# Patient Record
Sex: Male | Born: 1960
Health system: Southern US, Community
[De-identification: ages and names within clinical notes are randomized; demographics above are authoritative.]

## PROBLEM LIST (undated history)

## (undated) DIAGNOSIS — M549 Dorsalgia, unspecified: Secondary | ICD-10-CM

## (undated) DIAGNOSIS — G4730 Sleep apnea, unspecified: Secondary | ICD-10-CM

## (undated) DIAGNOSIS — K219 Gastro-esophageal reflux disease without esophagitis: Secondary | ICD-10-CM

## (undated) DIAGNOSIS — M654 Radial styloid tenosynovitis [de Quervain]: Secondary | ICD-10-CM

## (undated) HISTORY — PX: BACK SURGERY: SHX140

## (undated) HISTORY — PX: HERNIA REPAIR: SHX51

---

## 2004-01-08 ENCOUNTER — Ambulatory Visit (HOSPITAL_COMMUNITY): Admission: RE | Admit: 2004-01-08 | Discharge: 2004-01-08 | Payer: Self-pay | Admitting: Family Medicine

## 2005-08-09 ENCOUNTER — Emergency Department (HOSPITAL_COMMUNITY): Admission: EM | Admit: 2005-08-09 | Discharge: 2005-08-10 | Payer: Self-pay | Admitting: Emergency Medicine

## 2006-04-04 ENCOUNTER — Ambulatory Visit (HOSPITAL_COMMUNITY): Admission: RE | Admit: 2006-04-04 | Discharge: 2006-04-05 | Payer: Self-pay | Admitting: Neurosurgery

## 2008-10-28 ENCOUNTER — Encounter: Admission: RE | Admit: 2008-10-28 | Discharge: 2008-10-28 | Payer: Self-pay | Admitting: Family Medicine

## 2009-05-08 ENCOUNTER — Encounter: Admission: RE | Admit: 2009-05-08 | Discharge: 2009-05-08 | Payer: Self-pay | Admitting: Internal Medicine

## 2009-08-11 ENCOUNTER — Ambulatory Visit (HOSPITAL_COMMUNITY): Admission: RE | Admit: 2009-08-11 | Discharge: 2009-08-11 | Payer: Self-pay | Admitting: Neurosurgery

## 2010-06-07 LAB — CBC
HCT: 44.7 % (ref 39.0–52.0)
Hemoglobin: 15.1 g/dL (ref 13.0–17.0)
MCHC: 33.8 g/dL (ref 30.0–36.0)
MCV: 89.1 fL (ref 78.0–100.0)
Platelets: 321 10*3/uL (ref 150–400)
RBC: 5.01 MIL/uL (ref 4.22–5.81)
RDW: 13.6 % (ref 11.5–15.5)
WBC: 7.3 10*3/uL (ref 4.0–10.5)

## 2010-06-07 LAB — SURGICAL PCR SCREEN
MRSA, PCR: NEGATIVE
Staphylococcus aureus: NEGATIVE

## 2010-08-06 NOTE — Op Note (Signed)
NAME:  Robert Deleon, Robert Deleon          ACCOUNT NO.:  [id number]   MEDICAL RECORD NO.:  [id number]          PATIENT TYPE:  AMB   LOCATION:  SDS                          FACILITY:  [facility]   PHYSICIAN:  Reinaldo Meeker, M.D. DATE OF BIRTH:  [birth year]   DATE OF PROCEDURE:  04/04/2006  DATE OF DISCHARGE:                               OPERATIVE REPORT   PREOPERATIVE DIAGNOSIS:  Herniated disk, L2-3 left, with superior  fragment.   POSTOPERATIVE DIAGNOSIS:  Herniated disk, L2-3 left, with superior  fragment.   PROCEDURE:  Left L2-3 interlaminar laminotomy for excision of herniated  disk with the operating microscope.   SECONDARY PROCEDURE:  Microdissection, L2 and L3 nerve roots as well as  02/03 disk.   SURGEON:  Dr. Gerlene Fee.   ASSISTANT:  Dr. Julio Sicks   PROCEDURE IN DETAIL:  After being placed in the prone position, the  patient's back was prepped and draped in usual sterile fashion.  Localizing x-rays taken prior to incision to identify the appropriate  level.  Midline incision was made above the spinous processes of L2-L3.  Using Bovie cutting current, the incision was carried down the spinous  processes.  Subperiosteal dissection was then carried out along the left-  sided spinous processes, lamina and facet joint and self retaining  retractor was placed for exposure.  X-rays showed approach the  appropriate levels.  Using a high-speed drill, the inferior three-  quarters of the L2 lamina, medial one half of the facet joint in the  superior edge of the L3 lamina were removed.  Residual bone, ligamentum  flavum were removed in a piecemeal fashion.  L2-L3 nerve roots were  identified with the large herniated disk fragment located between the  two of them.  A microscope was draped, brought to the field and used for  remainder of the case.  Using a the L2-3 disk was coagulated and then  incised by 15 blade.  Using pituitary rongeurs and curettes, the disk  space clean-out was  carried out while at the same time, great care was  taken to avoid injury to the neural elements.  This was successfully  done.  Attention was then turned superior where the fragment was  identified.  This was initially fished out from underneath the L2 nerve  root and then without closing breakage of the fragment, a very large  single fragment was identified.  Instruments were then able to be passed  on the L2 nerve root without difficulty.  Additional foraminal clean-out  was carried out of the disk and then inspection was carried out in all  directions for any evidence of residual compression of the L2 or L3  nerve roots and none could be identified.  Large amounts of irrigation  were carried out at this time.  Any bleeding was controlled with bipolar  coagulation and Gelfoam.  The wound was then closed in multiple layers  of Vicryl in the muscle, fascia, subcutaneous and subcuticular tissues  and staples were placed on the skin.  A sterile dressings then applied.  The patient was extubated and taken to recovery room in stable  condition.  ______________________________  Reinaldo Meeker, M.D.     ROK/MEDQ  D:  04/04/2006  T:  04/05/2006  Job:  [id number]

## 2013-05-06 ENCOUNTER — Emergency Department (HOSPITAL_BASED_OUTPATIENT_CLINIC_OR_DEPARTMENT_OTHER)
Admission: EM | Admit: 2013-05-06 | Discharge: 2013-05-06 | Disposition: A | Discharge status: Home / Self Care | Payer: BC Managed Care – PPO | Attending: Emergency Medicine | Admitting: Emergency Medicine

## 2013-05-06 ENCOUNTER — Emergency Department (HOSPITAL_BASED_OUTPATIENT_CLINIC_OR_DEPARTMENT_OTHER): Payer: BC Managed Care – PPO

## 2013-05-06 ENCOUNTER — Encounter (HOSPITAL_BASED_OUTPATIENT_CLINIC_OR_DEPARTMENT_OTHER): Payer: Self-pay | Admitting: Emergency Medicine

## 2013-05-06 DIAGNOSIS — Z791 Long term (current) use of non-steroidal anti-inflammatories (NSAID): Secondary | ICD-10-CM | POA: Insufficient documentation

## 2013-05-06 DIAGNOSIS — M5430 Sciatica, unspecified side: Secondary | ICD-10-CM | POA: Insufficient documentation

## 2013-05-06 HISTORY — DX: Dorsalgia, unspecified: M54.9

## 2013-05-06 MED ORDER — LIDOCAINE 5 % EX PTCH: 1.0000 | MEDICATED_PATCH | CUTANEOUS | Status: DC

## 2013-05-06 MED ORDER — PREDNISONE 20 MG PO TABS: ORAL_TABLET | ORAL | Status: DC

## 2013-05-06 MED ORDER — MELOXICAM 7.5 MG PO TABS: 7.5000 mg | ORAL_TABLET | Freq: Every day | ORAL | Status: DC

## 2013-05-06 MED ORDER — KETOROLAC TROMETHAMINE 60 MG/2ML IM SOLN
60.0000 mg | Freq: Once | INTRAMUSCULAR | Status: AC
  Administered 2013-05-06: 60 mg via INTRAMUSCULAR
  Filled 2013-05-06: qty 2

## 2013-05-06 MED ORDER — METAXALONE 800 MG PO TABS: 800.0000 mg | ORAL_TABLET | Freq: Three times a day (TID) | ORAL | Status: DC

## 2013-05-06 MED ORDER — DEXAMETHASONE SODIUM PHOSPHATE 10 MG/ML IJ SOLN
10.0000 mg | Freq: Once | INTRAMUSCULAR | Status: AC
  Administered 2013-05-06: 10 mg via INTRAMUSCULAR
  Filled 2013-05-06: qty 1

## 2013-05-06 NOTE — ED Notes (Signed)
rx x 3 given for skelaxin, lidoderm and mobic- pt has a ride at bedside

## 2013-05-06 NOTE — ED Provider Notes (Signed)
CSN: [id number]     Arrival date & time 05/06/13  0115 History   First MD Initiated Contact with Patient 05/06/13 0139     Chief Complaint  Patient presents with  . Back Pain     (Consider location/radiation/quality/duration/timing/severity/associated sxs/prior Treatment) Patient is a 53 y.o. male presenting with back pain. The history is provided by the patient. No language interpreter was used.  Back Pain Location:  Sacro-iliac joint Quality:  Aching Radiates to:  R posterior upper leg Pain severity:  Moderate Pain is:  Same all the time Onset quality:  Gradual Timing:  Constant Progression:  Unchanged Chronicity:  Recurrent Context: lifting heavy objects   Context: not emotional stress, not MCA and not MVA   Relieved by:  Nothing Worsened by:  Nothing tried Ineffective treatments:  NSAIDs, narcotics and muscle relaxants Associated symptoms: no abdominal pain, no abdominal swelling, no bladder incontinence, no bowel incontinence, no chest pain, no dysuria, no fever, no headaches, no leg pain, no numbness, no paresthesias, no pelvic pain, no perianal numbness, no tingling, no weakness and no weight loss   Risk factors: no hx of cancer     Past Medical History  Diagnosis Date  . Back pain    Past Surgical History  Procedure Laterality Date  . Back surgery    . Hernia repair     No family history on file. History  Substance Use Topics  . Smoking status: Never Smoker   . Smokeless tobacco: Never Used  . Alcohol Use: Yes    Review of Systems  Constitutional: Negative for fever and weight loss.  Cardiovascular: Negative for chest pain.  Gastrointestinal: Negative for abdominal pain and bowel incontinence.  Genitourinary: Negative for bladder incontinence, dysuria and pelvic pain.  Musculoskeletal: Positive for back pain.  Neurological: Negative for tingling, weakness, numbness, headaches and paresthesias.  All other systems reviewed and are  negative.      Allergies  Review of patient's allergies indicates no known allergies.  Home Medications   Current Outpatient Rx  Name  Route  Sig  Dispense  Refill  . Methocarbamol (ROBAXIN PO)   Oral   Take by mouth.         . naproxen sodium (ANAPROX) 220 MG tablet   Oral   Take 220 mg by mouth 2 (two) times daily with a meal.         . oxyCODONE-acetaminophen (PERCOCET) 7.5-325 MG per tablet   Oral   Take 1 tablet by mouth every 4 (four) hours as needed for pain.          BP 139/81  Pulse 74  Temp(Src) 98 F (36.7 C) (Oral)  Resp 18  Ht 5\' 10"  (1.778 m)  Wt 198 lb (89.812 kg)  BMI 28.41 kg/m2  SpO2 100% Physical Exam  Constitutional: He is oriented to person, place, and time. He appears well-developed and well-nourished. No distress.  HENT:  Head: Normocephalic and atraumatic.  Mouth/Throat: Oropharynx is clear and moist.  Eyes: Conjunctivae are normal. Pupils are equal, round, and reactive to light.  Neck: Normal range of motion. Neck supple.  Cardiovascular: Normal rate, regular rhythm and intact distal pulses.   Pulmonary/Chest: Effort normal and breath sounds normal. He has no wheezes. He has no rales.  Abdominal: Soft. Bowel sounds are normal. There is no tenderness. There is no rebound and no guarding.  Musculoskeletal: Normal range of motion.  Neurological: He is alert and oriented to person, place, and time.  Skin: Skin is  warm and dry.  Psychiatric: He has a normal mood and affect.    ED Course  Procedures (including critical care time) Labs Review Labs Reviewed - No data to display Imaging Review Dg Lumbar Spine Complete  05/06/2013   CLINICAL DATA:  Back pain.  EXAM: LUMBAR SPINE - COMPLETE 4+ VIEW  COMPARISON:  None.  FINDINGS: Degenerative disc disease changes at L5-S1 and L2-3 with disc space narrowing and endplate spurring. Mild facet disease in the mid and lower lumbar spine. Normal alignment. No fracture. SI joints are symmetric and  unremarkable.  Calcification projects over the midpole of the left kidney, likely nonobstructing stone measuring up to 7 mm.  IMPRESSION: Degenerative changes in the lumbar spine. No acute bony abnormality.  Left nephrolithiasis.   Electronically Signed   By: Rolm Baptise M.D.   On: 05/06/2013 02:26    EKG Interpretation   None       MDM   Final diagnoses:  None    Will change to skelaxin, add lidoderm patches, prednisone and meloxicam.  Follow up with Dr. Hal Neer    Avo Schlachter Alfonso Patten, MD 05/06/13 [phone]

## 2013-05-06 NOTE — ED Provider Notes (Signed)
Called patient regarding RX for prednisone left in the ED, unsure if this is a good number  Tildon Silveria K Mercy Leppla-Rasch, MD 05/06/13 [phone]

## 2013-05-06 NOTE — ED Notes (Signed)
Hx of back surgery x 4- lifted heavy item on [facility]- pain has been getting progressively worse since then

## 2013-05-09 ENCOUNTER — Other Ambulatory Visit: Payer: Self-pay | Admitting: Specialist

## 2013-05-09 DIAGNOSIS — M545 Low back pain, unspecified: Secondary | ICD-10-CM

## 2013-05-10 ENCOUNTER — Ambulatory Visit
Admission: RE | Admit: 2013-05-10 | Discharge: 2013-05-10 | Disposition: A | Discharge status: Home / Self Care | Payer: BC Managed Care – PPO | Source: Ambulatory Visit | Attending: Specialist | Admitting: Specialist

## 2013-05-10 DIAGNOSIS — M545 Low back pain, unspecified: Secondary | ICD-10-CM

## 2013-05-10 DIAGNOSIS — M5126 Other intervertebral disc displacement, lumbar region: Secondary | ICD-10-CM | POA: Diagnosis present

## 2013-05-10 MED ORDER — GADOBENATE DIMEGLUMINE 529 MG/ML IV SOLN
18.0000 mL | Freq: Once | INTRAVENOUS | Status: AC | PRN
  Administered 2013-05-10: 18 mL via INTRAVENOUS

## 2013-05-10 NOTE — H&P (Addendum)
Robert Deleon is an 53 y.o. male.   Chief Complaint:Severe low back and bilateral leg pain and numbness.  HPI:53 year old male with history of multiple lumbar surgeries in the past,1996, 1998 and 2007. Has been followed for persisting pain And impairment of his back by Dr. Junius Roads and myself. He has been treated for persistent back greater than leg pain for the past 3 years. Takes medications of percocet and meloxicam and muscle relaxants chronicly. 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 Pain progressed Into his right leg and now into both legs posteriorly with associated weakness. Presented to the emergency room early  Monday 05/06/2013, given IV Toradol and referred to our office Monday afternoon. Positive sciatic tension signs and significant weakness in the right L4 and L5 distribution. He is experiencing increasing difficulty with walking now using a walker. MRI done 05/10/2013 shows a very large disc herniation that fills the spinal Canal at L4-5 and causes severe thecal sac compression. He is experiencing cauda equina syndrome and is being brought to the OR today to  Undergo bilateral microdiscectomy at the L4-5 level for progressive weakness due to cauda equina compression by a large disc herniation L4-5. Bowel and bladder functioning normally.  Past Medical History  Diagnosis Date  . Back pain     Past Surgical History  Procedure Laterality Date  . Back surgery    . Hernia repair      History reviewed. No pertinent family history. Social History:  reports that he has never smoked. He has never used smokeless tobacco. He reports that he drinks alcohol. He reports that he does not use illicit drugs.  Allergies: No Known Allergies  Medications Prior to Admission  Medication Sig Dispense Refill  . methocarbamol (ROBAXIN) 750 MG tablet Take  750 mg by mouth every 6 (six) hours.      Marland Kitchen oxyCODONE-acetaminophen (PERCOCET) 7.5-325 MG per tablet Take 1 tablet by mouth every 4 (four) hours as needed for pain.      . ranitidine (ZANTAC) 150 MG tablet Take 150 mg by mouth 2 (two) times daily.      . metaxalone (SKELAXIN) 800 MG tablet Take 1 tablet (800 mg total) by mouth 3 (three) times daily.  21 tablet  0    No results found for this or any previous visit (from the past 48 hour(s)). Mr Lumbar Spine W Wo Contrast  05/10/2013   CLINICAL DATA:  Central and right-sided low back pain with bilateral leg and foot pain, weakness and numbness. History of 4 microdiscectomies. Recent lifting injury.  EXAM: MRI LUMBAR SPINE WITHOUT AND WITH CONTRAST  TECHNIQUE: Multiplanar and multiecho pulse sequences of the lumbar spine were obtained without and with intravenous contrast.  CONTRAST:  66mL MULTIHANCE GADOBENATE DIMEGLUMINE 529 MG/ML IV SOLN  COMPARISON:  DG LUMBAR SPINE COMPLETE dated 05/06/2013; MR L SPINE WO/W CM dated 05/08/2009  FINDINGS: There is straightening of the normal lumbar lordosis, unchanged. There is no listhesis. There is moderate disc space narrowing at L2-3 with adjacent degenerative marrow changes, unchanged. Severe disc space narrowing and mild degenerative endplate changes at A2-Z3 are stable. There is progressive disc space height loss at L4-5. The conus medullaris terminates at T12. Visualized retroperitoneal soft tissues are unremarkable.  L1-2:  Negative.  L2-3: Prior left hemilaminectomy. Disc bulge eccentric to the left results in mild to moderate left neural  foraminal stenosis, unchanged. No spinal canal stenosis.  L3-4: Mild disc bulge and facet and ligamentum flavum hypertrophy result in mild spinal canal narrowing, mildly increased from prior. No neural foraminal stenosis.  L4-5: There is a new, very large left central disc extrusion with cranial migration to the superior L4 vertebral body level. This disc extrusion completely fills  the spinal canal completely effacing the thecal sac. Enhancement is noted at the margins of the extruded disc. No neural foraminal stenosis. Mild facet hypertrophy. Prior left hemilaminectomy.  L5-S1: Postsurgical changes. Left subarticular/foraminal endplate osteophyte without spinal canal or neural foraminal stenosis, unchanged.  IMPRESSION: 1. New, very large disc extrusion at L4-5 resulting in severe spinal canal stenosis. 2. Unchanged mild-to-moderate left neural foraminal stenosis at L2-3.   Electronically Signed   By: Logan Bores   On: 05/10/2013 16:11    Review of Systems  Constitutional: Negative for fever, chills, weight loss, malaise/fatigue and diaphoresis.  HENT: Negative.  Negative for congestion, ear discharge, ear pain, hearing loss, nosebleeds, sore throat and tinnitus.   Eyes: Negative for blurred vision, double vision, photophobia, pain, discharge and redness.  Respiratory: Negative.  Negative for cough, sputum production, shortness of breath, wheezing and stridor.   Cardiovascular: Positive for claudication. Negative for chest pain, palpitations, orthopnea, leg swelling and PND.  Gastrointestinal: Negative for heartburn, abdominal pain, diarrhea, constipation, blood in stool and melena.  Genitourinary: Negative for dysuria, urgency, frequency, hematuria and flank pain.  Musculoskeletal: Positive for back pain. Negative for falls, joint pain, myalgias and neck pain.  Skin: Negative.  Negative for itching and rash.  Neurological: Positive for tingling, sensory change, focal weakness and weakness. Negative for dizziness, tremors, speech change, seizures, loss of consciousness and headaches.  Endo/Heme/Allergies: Negative for environmental allergies and polydipsia. Does not bruise/bleed easily.  Psychiatric/Behavioral: Negative for depression, suicidal ideas, hallucinations, memory loss and substance abuse. The patient is not nervous/anxious and does not have insomnia.     Blood  pressure 154/82, pulse 65, temperature 97.8 F (36.6 C), temperature source Oral, resp. rate 18, weight 89.812 kg (198 lb), SpO2 99.00%. Physical Exam  Constitutional: He is oriented to person, place, and time. He appears well-developed and well-nourished. He appears distressed.  HENT:  Head: Normocephalic and atraumatic.  Right Ear: External ear normal.  Left Ear: External ear normal.  Nose: Nose normal.  Mouth/Throat: Oropharynx is clear and moist. No oropharyngeal exudate.  Eyes: Conjunctivae and EOM are normal. Pupils are equal, round, and reactive to light. Right eye exhibits no discharge. Left eye exhibits no discharge. No scleral icterus.  Neck: Normal range of motion. Neck supple. No JVD present. No tracheal deviation present. No thyromegaly present.  Cardiovascular: Normal rate, regular rhythm, normal heart sounds and intact distal pulses.  Exam reveals no gallop and no friction rub.   No murmur heard. Respiratory: Effort normal and breath sounds normal. No stridor. No respiratory distress. He has no wheezes. He has no rales. He exhibits no tenderness.  GI: Soft. Bowel sounds are normal. He exhibits no distension and no mass. There is no tenderness. There is no rebound and no guarding.  Musculoskeletal: He exhibits no edema and no tenderness.  Lymphadenopathy:    He has no cervical adenopathy.  Neurological: He is alert and oriented to person, place, and time. He displays abnormal reflex. No cranial nerve deficit. He exhibits normal muscle tone. Coordination normal.  Skin: Skin is warm and dry. No rash noted. He is not diaphoretic. No erythema. No pallor.  Psychiatric:  He has a normal mood and affect. His behavior is normal. Judgment and thought content normal.   Orthopaedic Exam: Alet oriented x4. Pushing up to arise from chair using a walker to ambulate. Neurotension signs including SLR right and left positive. Right poplital compression Sign is positive. Right foot dorsiflexion  weakness 1/5.Plantar flexion 2/5. Left leg foot dorsiflexion weakness 4/5. Left plantar flexion weakness 4/5. Unable to heel walk either side. Forward bending is fingertips to the knee with pain. MRI with severe central canal narrowing L4-5 due to a large disc herniation. This causes severe thecal sac compression.   Assessment/Plan Lumbar disc herniation L4-5 with severe thecal sac compression and progression lower extremity weakness, cauda equina syndrome.  Plan: This patient is to undergo bilateral microdiscectomy L4-5 for large central herniated disc with severe thecal sac compression. He was placed on oral steriods and increased Percocet doses.The risks, benefits and alternative treatments have been discussed  extensively,questions answered.  The patient has elected to undergo the discussed surgical treatment. NITKA,JAMES E 05/11/2013, 7:38 AM   Patient was seen and examined in the preop holding area. There has been no interval  Change in this patient's exam preop  history and physical exam  Lab tests and images have been examined and reviewed.  The Risks benefits and alternative treatments have been discussed  extensively,questions answered.  The patient has elected to undergo the discussed surgical treatment.

## 2013-05-11 ENCOUNTER — Encounter (HOSPITAL_COMMUNITY): Payer: Self-pay | Admitting: *Deleted

## 2013-05-11 ENCOUNTER — Encounter (HOSPITAL_COMMUNITY): Payer: BC Managed Care – PPO | Admitting: Anesthesiology

## 2013-05-11 ENCOUNTER — Inpatient Hospital Stay (HOSPITAL_COMMUNITY): Payer: BC Managed Care – PPO | Admitting: Anesthesiology

## 2013-05-11 ENCOUNTER — Ambulatory Visit (HOSPITAL_COMMUNITY)
Admission: RE | Admit: 2013-05-11 | Discharge: 2013-05-12 | Disposition: A | Discharge status: Home / Self Care | Payer: BC Managed Care – PPO | Attending: Specialist | Admitting: Specialist

## 2013-05-11 ENCOUNTER — Inpatient Hospital Stay (HOSPITAL_COMMUNITY): Payer: BC Managed Care – PPO

## 2013-05-11 ENCOUNTER — Encounter (HOSPITAL_COMMUNITY)
Admission: RE | Disposition: A | Discharge status: Home / Self Care | Payer: Self-pay | Source: Home / Self Care | Attending: Specialist

## 2013-05-11 DIAGNOSIS — G834 Cauda equina syndrome: Secondary | ICD-10-CM | POA: Insufficient documentation

## 2013-05-11 DIAGNOSIS — M5126 Other intervertebral disc displacement, lumbar region: Secondary | ICD-10-CM | POA: Diagnosis present

## 2013-05-11 HISTORY — PX: LUMBAR LAMINECTOMY: SHX95

## 2013-05-11 LAB — SURGICAL PCR SCREEN
MRSA, PCR: NEGATIVE
STAPHYLOCOCCUS AUREUS: POSITIVE — AB

## 2013-05-11 LAB — BASIC METABOLIC PANEL
BUN: 20 mg/dL (ref 6–23)
CHLORIDE: 103 meq/L (ref 96–112)
CO2: 22 mEq/L (ref 19–32)
Calcium: 9.7 mg/dL (ref 8.4–10.5)
Creatinine, Ser: 1.02 mg/dL (ref 0.50–1.35)
GFR, EST NON AFRICAN AMERICAN: 83 mL/min — AB (ref >90)
Glucose, Bld: 95 mg/dL (ref 70–99)
POTASSIUM: 4.2 meq/L (ref 3.7–5.3)
Sodium: 139 mEq/L (ref 137–147)

## 2013-05-11 LAB — CBC
HEMATOCRIT: 46.6 % (ref 39.0–52.0)
Hemoglobin: 16.1 g/dL (ref 13.0–17.0)
MCH: 30.1 pg (ref 26.0–34.0)
MCHC: 34.5 g/dL (ref 30.0–36.0)
MCV: 87.1 fL (ref 78.0–100.0)
Platelets: 293 10*3/uL (ref 150–400)
RBC: 5.35 MIL/uL (ref 4.22–5.81)
RDW: 13.2 % (ref 11.5–15.5)
WBC: 9 10*3/uL (ref 4.0–10.5)

## 2013-05-11 SURGERY — MICRODISCECTOMY LUMBAR LAMINECTOMY
Anesthesia: General | Site: Back | Laterality: Bilateral

## 2013-05-11 MED ORDER — MUPIROCIN 2 % EX OINT
TOPICAL_OINTMENT | Freq: Two times a day (BID) | CUTANEOUS | Status: DC
  Administered 2013-05-11: 21:00:00 via NASAL
  Administered 2013-05-11: 1 via NASAL
  Administered 2013-05-12: 10:00:00 via NASAL
  Filled 2013-05-11: qty 22

## 2013-05-11 MED ORDER — THROMBIN 20000 UNITS EX KIT
PACK | CUTANEOUS | Status: DC | PRN
  Administered 2013-05-11: 09:00:00 via TOPICAL

## 2013-05-11 MED ORDER — CEFAZOLIN SODIUM-DEXTROSE 2-3 GM-% IV SOLR
2.0000 g | INTRAVENOUS | Status: AC
  Administered 2013-05-11: 2 g via INTRAVENOUS

## 2013-05-11 MED ORDER — EPHEDRINE SULFATE 50 MG/ML IJ SOLN
INTRAMUSCULAR | Status: DC | PRN
  Administered 2013-05-11: 10 mg via INTRAVENOUS

## 2013-05-11 MED ORDER — DEXTROSE 5 % IV SOLN
500.0000 mg | Freq: Four times a day (QID) | INTRAVENOUS | Status: DC | PRN
  Filled 2013-05-11: qty 5

## 2013-05-11 MED ORDER — LIDOCAINE HCL (CARDIAC) 20 MG/ML IV SOLN
INTRAVENOUS | Status: DC | PRN
Start: 2013-05-11 — End: 2013-05-11
  Administered 2013-05-11: 100 mg via INTRAVENOUS

## 2013-05-11 MED ORDER — PROPOFOL 10 MG/ML IV BOLUS
INTRAVENOUS | Status: AC
  Filled 2013-05-11: qty 20

## 2013-05-11 MED ORDER — SODIUM CHLORIDE 0.9 % IV SOLN: 250.0000 mL | INTRAVENOUS | Status: DC

## 2013-05-11 MED ORDER — DEXTROSE IN LACTATED RINGERS 5 % IV SOLN: INTRAVENOUS | Status: DC

## 2013-05-11 MED ORDER — NEOSTIGMINE METHYLSULFATE 1 MG/ML IJ SOLN
INTRAMUSCULAR | Status: AC
  Filled 2013-05-11: qty 10

## 2013-05-11 MED ORDER — ACETAMINOPHEN 500 MG PO TABS
ORAL_TABLET | ORAL | Status: AC
  Filled 2013-05-11: qty 2

## 2013-05-11 MED ORDER — GLYCOPYRROLATE 0.2 MG/ML IJ SOLN
INTRAMUSCULAR | Status: DC | PRN
  Administered 2013-05-11: .8 mg via INTRAVENOUS

## 2013-05-11 MED ORDER — EPHEDRINE SULFATE 50 MG/ML IJ SOLN
INTRAMUSCULAR | Status: AC
  Filled 2013-05-11: qty 1

## 2013-05-11 MED ORDER — STERILE WATER FOR INJECTION IJ SOLN
INTRAMUSCULAR | Status: AC
  Filled 2013-05-11: qty 10

## 2013-05-11 MED ORDER — ONDANSETRON HCL 4 MG/2ML IJ SOLN
INTRAMUSCULAR | Status: DC | PRN
  Administered 2013-05-11: 4 mg via INTRAVENOUS

## 2013-05-11 MED ORDER — KETOROLAC TROMETHAMINE 30 MG/ML IJ SOLN
INTRAMUSCULAR | Status: AC
  Filled 2013-05-11: qty 1

## 2013-05-11 MED ORDER — LIDOCAINE HCL (CARDIAC) 20 MG/ML IV SOLN
INTRAVENOUS | Status: AC
  Filled 2013-05-11: qty 5

## 2013-05-11 MED ORDER — BUPIVACAINE LIPOSOME 1.3 % IJ SUSP
20.0000 mL | INTRAMUSCULAR | Status: AC
  Filled 2013-05-11: qty 20

## 2013-05-11 MED ORDER — FAMOTIDINE 20 MG PO TABS
20.0000 mg | ORAL_TABLET | Freq: Two times a day (BID) | ORAL | Status: DC
  Administered 2013-05-11 – 2013-05-12 (×3): 20 mg via ORAL
  Filled 2013-05-11 (×4): qty 1

## 2013-05-11 MED ORDER — 0.9 % SODIUM CHLORIDE (POUR BTL) OPTIME
TOPICAL | Status: DC | PRN
  Administered 2013-05-11: 1000 mL

## 2013-05-11 MED ORDER — LACTATED RINGERS IV SOLN
INTRAVENOUS | Status: DC
  Administered 2013-05-11: 07:00:00 via INTRAVENOUS

## 2013-05-11 MED ORDER — HYDROMORPHONE HCL PF 1 MG/ML IJ SOLN
0.5000 mg | INTRAMUSCULAR | Status: DC | PRN
  Administered 2013-05-11 – 2013-05-12 (×2): 1 mg via INTRAVENOUS
  Filled 2013-05-11: qty 1

## 2013-05-11 MED ORDER — ONDANSETRON HCL 4 MG/2ML IJ SOLN: 4.0000 mg | Freq: Once | INTRAMUSCULAR | Status: DC | PRN

## 2013-05-11 MED ORDER — THROMBIN 20000 UNITS EX SOLR
CUTANEOUS | Status: AC
  Filled 2013-05-11: qty 20000

## 2013-05-11 MED ORDER — PHENYLEPHRINE HCL 10 MG/ML IJ SOLN
INTRAMUSCULAR | Status: DC | PRN
  Administered 2013-05-11: 40 ug via INTRAVENOUS

## 2013-05-11 MED ORDER — ROCURONIUM BROMIDE 50 MG/5ML IV SOLN
INTRAVENOUS | Status: AC
  Filled 2013-05-11: qty 1

## 2013-05-11 MED ORDER — ALUM & MAG HYDROXIDE-SIMETH 200-200-20 MG/5ML PO SUSP: 30.0000 mL | Freq: Four times a day (QID) | ORAL | Status: DC | PRN

## 2013-05-11 MED ORDER — POLYETHYLENE GLYCOL 3350 17 G PO PACK: 17.0000 g | PACK | Freq: Every day | ORAL | Status: DC | PRN

## 2013-05-11 MED ORDER — OXYCODONE-ACETAMINOPHEN 7.5-325 MG PO TABS: 1.0000 | ORAL_TABLET | ORAL | Status: DC | PRN

## 2013-05-11 MED ORDER — PHENOL 1.4 % MT LIQD: 1.0000 | OROMUCOSAL | Status: DC | PRN

## 2013-05-11 MED ORDER — FENTANYL CITRATE 0.05 MG/ML IJ SOLN
INTRAMUSCULAR | Status: DC | PRN
  Administered 2013-05-11 (×3): 50 ug via INTRAVENOUS
  Administered 2013-05-11: 100 ug via INTRAVENOUS

## 2013-05-11 MED ORDER — DOCUSATE SODIUM 100 MG PO CAPS
100.0000 mg | ORAL_CAPSULE | Freq: Two times a day (BID) | ORAL | Status: DC
  Administered 2013-05-11 – 2013-05-12 (×3): 100 mg via ORAL
  Filled 2013-05-11 (×3): qty 1

## 2013-05-11 MED ORDER — HYDROCODONE-ACETAMINOPHEN 5-325 MG PO TABS
ORAL_TABLET | ORAL | Status: AC
  Administered 2013-05-11: 2 via ORAL
  Filled 2013-05-11: qty 2

## 2013-05-11 MED ORDER — DEXAMETHASONE 4 MG PO TABS
4.0000 mg | ORAL_TABLET | Freq: Four times a day (QID) | ORAL | Status: DC
  Administered 2013-05-11 – 2013-05-12 (×4): 4 mg via ORAL
  Filled 2013-05-11 (×8): qty 1

## 2013-05-11 MED ORDER — BUPIVACAINE-EPINEPHRINE (PF) 0.5% -1:200000 IJ SOLN
INTRAMUSCULAR | Status: AC
  Filled 2013-05-11: qty 10

## 2013-05-11 MED ORDER — ACETAMINOPHEN 325 MG PO TABS: 650.0000 mg | ORAL_TABLET | ORAL | Status: DC | PRN

## 2013-05-11 MED ORDER — HYDROMORPHONE HCL PF 1 MG/ML IJ SOLN
0.2500 mg | INTRAMUSCULAR | Status: DC | PRN
  Administered 2013-05-11 (×4): 0.5 mg via INTRAVENOUS

## 2013-05-11 MED ORDER — ACETAMINOPHEN 650 MG RE SUPP: 650.0000 mg | RECTAL | Status: DC | PRN

## 2013-05-11 MED ORDER — SODIUM CHLORIDE 0.9 % IJ SOLN: 3.0000 mL | Freq: Two times a day (BID) | INTRAMUSCULAR | Status: DC

## 2013-05-11 MED ORDER — OXYCODONE-ACETAMINOPHEN 5-325 MG PO TABS
1.0000 | ORAL_TABLET | ORAL | Status: DC | PRN
  Administered 2013-05-11 – 2013-05-12 (×3): 2 via ORAL
  Filled 2013-05-11 (×2): qty 2

## 2013-05-11 MED ORDER — MENTHOL 3 MG MT LOZG: 1.0000 | LOZENGE | OROMUCOSAL | Status: DC | PRN

## 2013-05-11 MED ORDER — HYDROMORPHONE HCL PF 1 MG/ML IJ SOLN
INTRAMUSCULAR | Status: AC
  Administered 2013-05-11: 0.5 mg via INTRAVENOUS
  Filled 2013-05-11: qty 1

## 2013-05-11 MED ORDER — OXYCODONE-ACETAMINOPHEN 5-325 MG PO TABS
ORAL_TABLET | ORAL | Status: AC
  Administered 2013-05-11: 2 via ORAL
  Filled 2013-05-11: qty 2

## 2013-05-11 MED ORDER — KETOROLAC TROMETHAMINE 30 MG/ML IJ SOLN
30.0000 mg | Freq: Once | INTRAMUSCULAR | Status: AC
  Administered 2013-05-11: 30 mg via INTRAVENOUS

## 2013-05-11 MED ORDER — SODIUM CHLORIDE 0.9 % IJ SOLN: 3.0000 mL | INTRAMUSCULAR | Status: DC | PRN

## 2013-05-11 MED ORDER — LACTATED RINGERS IV SOLN: INTRAVENOUS | Status: DC

## 2013-05-11 MED ORDER — FENTANYL CITRATE 0.05 MG/ML IJ SOLN
INTRAMUSCULAR | Status: AC
  Filled 2013-05-11: qty 5

## 2013-05-11 MED ORDER — ONDANSETRON HCL 4 MG/2ML IJ SOLN: 4.0000 mg | INTRAMUSCULAR | Status: DC | PRN

## 2013-05-11 MED ORDER — ONDANSETRON HCL 4 MG/2ML IJ SOLN
INTRAMUSCULAR | Status: AC
  Filled 2013-05-11: qty 2

## 2013-05-11 MED ORDER — BUPIVACAINE-EPINEPHRINE 0.5% -1:200000 IJ SOLN
INTRAMUSCULAR | Status: DC | PRN
  Administered 2013-05-11: 20 mL

## 2013-05-11 MED ORDER — MIDAZOLAM HCL 5 MG/5ML IJ SOLN
INTRAMUSCULAR | Status: DC | PRN
  Administered 2013-05-11: 2 mg via INTRAVENOUS

## 2013-05-11 MED ORDER — FLEET ENEMA 7-19 GM/118ML RE ENEM: 1.0000 | ENEMA | Freq: Once | RECTAL | Status: AC | PRN

## 2013-05-11 MED ORDER — CEFAZOLIN SODIUM 1-5 GM-% IV SOLN
1.0000 g | Freq: Three times a day (TID) | INTRAVENOUS | Status: AC
  Administered 2013-05-11 – 2013-05-12 (×2): 1 g via INTRAVENOUS
  Filled 2013-05-11 (×2): qty 50

## 2013-05-11 MED ORDER — BUPIVACAINE LIPOSOME 1.3 % IJ SUSP
INTRAMUSCULAR | Status: DC | PRN
  Administered 2013-05-11: 20 mL

## 2013-05-11 MED ORDER — NEOSTIGMINE METHYLSULFATE 1 MG/ML IJ SOLN
INTRAMUSCULAR | Status: DC | PRN
  Administered 2013-05-11: 5 mg via INTRAVENOUS

## 2013-05-11 MED ORDER — HYDROMORPHONE HCL PF 1 MG/ML IJ SOLN
INTRAMUSCULAR | Status: AC
  Administered 2013-05-11: 1 mg via INTRAVENOUS
  Filled 2013-05-11: qty 1

## 2013-05-11 MED ORDER — DEXAMETHASONE SODIUM PHOSPHATE 4 MG/ML IJ SOLN
4.0000 mg | Freq: Four times a day (QID) | INTRAMUSCULAR | Status: DC
  Filled 2013-05-11 (×8): qty 1

## 2013-05-11 MED ORDER — METHOCARBAMOL 500 MG PO TABS
500.0000 mg | ORAL_TABLET | Freq: Four times a day (QID) | ORAL | Status: DC | PRN
  Administered 2013-05-11: 500 mg via ORAL

## 2013-05-11 MED ORDER — MUPIROCIN 2 % EX OINT
TOPICAL_OINTMENT | CUTANEOUS | Status: AC
  Filled 2013-05-11: qty 22

## 2013-05-11 MED ORDER — LACTATED RINGERS IV SOLN
INTRAVENOUS | Status: DC | PRN
  Administered 2013-05-11 (×2): via INTRAVENOUS

## 2013-05-11 MED ORDER — CEFAZOLIN SODIUM-DEXTROSE 2-3 GM-% IV SOLR
INTRAVENOUS | Status: AC
  Filled 2013-05-11: qty 50

## 2013-05-11 MED ORDER — MIDAZOLAM HCL 2 MG/2ML IJ SOLN
INTRAMUSCULAR | Status: AC
  Filled 2013-05-11: qty 2

## 2013-05-11 MED ORDER — HYDROCODONE-ACETAMINOPHEN 5-325 MG PO TABS
1.0000 | ORAL_TABLET | ORAL | Status: DC | PRN
  Administered 2013-05-11 (×2): 2 via ORAL
  Filled 2013-05-11 (×2): qty 2

## 2013-05-11 MED ORDER — SODIUM CHLORIDE 0.45 % IV SOLN
INTRAVENOUS | Status: DC
Start: 2013-05-11 — End: 2013-05-12
  Administered 2013-05-11: 15:00:00 via INTRAVENOUS

## 2013-05-11 MED ORDER — BISACODYL 5 MG PO TBEC: 5.0000 mg | DELAYED_RELEASE_TABLET | Freq: Every day | ORAL | Status: DC | PRN

## 2013-05-11 MED ORDER — ACETAMINOPHEN 500 MG PO TABS
1000.0000 mg | ORAL_TABLET | Freq: Once | ORAL | Status: AC
  Administered 2013-05-11: 1000 mg via ORAL

## 2013-05-11 MED ORDER — GLYCOPYRROLATE 0.2 MG/ML IJ SOLN
INTRAMUSCULAR | Status: AC
  Filled 2013-05-11: qty 4

## 2013-05-11 MED ORDER — PROPOFOL 10 MG/ML IV BOLUS
INTRAVENOUS | Status: DC | PRN
  Administered 2013-05-11: 200 mg via INTRAVENOUS

## 2013-05-11 MED ORDER — ROCURONIUM BROMIDE 100 MG/10ML IV SOLN
INTRAVENOUS | Status: DC | PRN
  Administered 2013-05-11: 50 mg via INTRAVENOUS
  Administered 2013-05-11 (×2): 10 mg via INTRAVENOUS

## 2013-05-11 MED ORDER — METHOCARBAMOL 500 MG PO TABS
ORAL_TABLET | ORAL | Status: AC
  Administered 2013-05-11: 500 mg via ORAL
  Filled 2013-05-11: qty 1

## 2013-05-11 SURGICAL SUPPLY — 58 items
BUR RND FLUTED 2.5 (BURR) IMPLANT
BUR ROUND FLUTED 4 SOFT TCH (BURR) ×2 IMPLANT
BUR ROUND FLUTED 4MM SOFT TCH (BURR) ×1
BUR SABER RD CUTTING 3.0 (BURR) IMPLANT
BUR SABER RD CUTTING 3.0MM (BURR)
CANISTER SUCTION 2500CC (MISCELLANEOUS) ×3 IMPLANT
CLOTH BEACON ORANGE TIMEOUT ST (SAFETY) ×3 IMPLANT
CORDS BIPOLAR (ELECTRODE) ×3 IMPLANT
COVER MAYO STAND STRL (DRAPES) ×3 IMPLANT
COVER SURGICAL LIGHT HANDLE (MISCELLANEOUS) ×3 IMPLANT
DERMABOND ADVANCED (GAUZE/BANDAGES/DRESSINGS) ×2
DERMABOND ADVANCED .7 DNX12 (GAUZE/BANDAGES/DRESSINGS) ×1 IMPLANT
DRAPE C-ARM 42X72 X-RAY (DRAPES) ×3 IMPLANT
DRAPE MICROSCOPE LEICA (MISCELLANEOUS) ×3 IMPLANT
DRAPE POUCH INSTRU U-SHP 10X18 (DRAPES) IMPLANT
DRAPE PROXIMA HALF (DRAPES) ×6 IMPLANT
DRAPE SURG 17X23 STRL (DRAPES) ×9 IMPLANT
DRSG MEPILEX BORDER 4X4 (GAUZE/BANDAGES/DRESSINGS) ×3 IMPLANT
DRSG MEPILEX BORDER 4X8 (GAUZE/BANDAGES/DRESSINGS) IMPLANT
DURAPREP 26ML APPLICATOR (WOUND CARE) ×3 IMPLANT
ELECT BLADE 4.0 EZ CLEAN MEGAD (MISCELLANEOUS) ×3
ELECT CAUTERY BLADE 6.4 (BLADE) ×3 IMPLANT
ELECT REM PT RETURN 9FT ADLT (ELECTROSURGICAL) ×3
ELECTRODE BLDE 4.0 EZ CLN MEGD (MISCELLANEOUS) ×1 IMPLANT
ELECTRODE REM PT RTRN 9FT ADLT (ELECTROSURGICAL) ×1 IMPLANT
GLOVE BIOGEL PI IND STRL 7.5 (GLOVE) ×1 IMPLANT
GLOVE BIOGEL PI INDICATOR 7.5 (GLOVE) ×2
GLOVE ECLIPSE 7.0 STRL STRAW (GLOVE) ×3 IMPLANT
GLOVE ECLIPSE 8.5 STRL (GLOVE) ×3 IMPLANT
GLOVE SURG 8.5 LATEX PF (GLOVE) ×3 IMPLANT
GOWN PREVENTION PLUS LG XLONG (DISPOSABLE) ×3 IMPLANT
GOWN STRL NON-REIN LRG LVL3 (GOWN DISPOSABLE) ×3 IMPLANT
GOWN STRL REUS W/TWL 2XL LVL3 (GOWN DISPOSABLE) ×3 IMPLANT
KIT BASIN OR (CUSTOM PROCEDURE TRAY) ×3 IMPLANT
KIT ROOM TURNOVER OR (KITS) ×3 IMPLANT
NEEDLE 22X1 1/2 (OR ONLY) (NEEDLE) ×3 IMPLANT
NEEDLE SPNL 18GX3.5 QUINCKE PK (NEEDLE) ×6 IMPLANT
NS IRRIG 1000ML POUR BTL (IV SOLUTION) ×3 IMPLANT
PACK LAMINECTOMY ORTHO (CUSTOM PROCEDURE TRAY) ×3 IMPLANT
PAD ARMBOARD 7.5X6 YLW CONV (MISCELLANEOUS) ×6 IMPLANT
PATTIES SURGICAL .5 X.5 (GAUZE/BANDAGES/DRESSINGS) ×3 IMPLANT
PATTIES SURGICAL .75X.75 (GAUZE/BANDAGES/DRESSINGS) IMPLANT
SLEEVE SURGEON STRL (DRAPES) ×3 IMPLANT
SPONGE LAP 4X18 X RAY DECT (DISPOSABLE) ×6 IMPLANT
SPONGE SURGIFOAM ABS GEL 100 (HEMOSTASIS) ×3 IMPLANT
SUT VIC AB 0 CT1 27 (SUTURE) ×2
SUT VIC AB 0 CT1 27XBRD ANBCTR (SUTURE) ×1 IMPLANT
SUT VIC AB 1 CT1 27 (SUTURE) ×2
SUT VIC AB 1 CT1 27XBRD ANBCTR (SUTURE) ×1 IMPLANT
SUT VIC AB 2-0 CT1 27 (SUTURE) ×2
SUT VIC AB 2-0 CT1 TAPERPNT 27 (SUTURE) ×1 IMPLANT
SUT VICRYL 0 UR6 27IN ABS (SUTURE) IMPLANT
SUT VICRYL 4-0 PS2 18IN ABS (SUTURE) ×3 IMPLANT
SYR CONTROL 10ML LL (SYRINGE) ×3 IMPLANT
TOWEL OR 17X24 6PK STRL BLUE (TOWEL DISPOSABLE) ×3 IMPLANT
TOWEL OR 17X26 10 PK STRL BLUE (TOWEL DISPOSABLE) ×3 IMPLANT
TRAY FOLEY CATH 16FRSI W/METER (SET/KITS/TRAYS/PACK) ×3 IMPLANT
WATER STERILE IRR 1000ML POUR (IV SOLUTION) ×3 IMPLANT

## 2013-05-11 NOTE — Discharge Instructions (Signed)
    No lifting greater than 10 lbs. Avoid bending, stooping and twisting. Walk in house for first week them may start to get out slowly increasing distance up to one mile by 3 weeks post op. Keep incision dry for 3 days, may use tegaderm or similar water impervious dressing.  

## 2013-05-11 NOTE — Progress Notes (Signed)
-  Pt on hold for [place] bed. Alert and oriented. VSS. Transferred from stretcher to bed and moved to isolation room in PACU for comfort and privacy. -Family at bedside.  -Regular diet tray ordered. -Call bell in reach.

## 2013-05-11 NOTE — Brief Op Note (Signed)
05/11/2013  9:52 AM  PATIENT:  Marca Ancona Leider  53 y.o. male  PRE-OPERATIVE DIAGNOSIS:  ruptured disc lumbar 4-5   POST-OPERATIVE DIAGNOSIS:  ruptured disc lumbar 4-5  PROCEDURE:  Procedure(s): MICRODISCECTOMY LUMBAR LAMINECTOMY  L 4-5 (Bilateral)  SURGEON:  Surgeon(s) and Role:       * Jessy Oto, MD - Primary       * Marybelle Killings, MD - Assisting}   ANESTHESIA:General, Supplemented with local anesthesia, marcaine 1/2% with 1/200,000 epi 100 cc combined with 20 cc of 1.3% exparel total of 10cc and 10cc of marcaine 1/2% plain, Dr. Sherren Kerns.  EBL:  Total I/O In: 1000 [I.V.:1000] Out: 200 [Urine:100; Blood:100]  BLOOD ADMINISTERED:none  DRAINS: Urinary Catheter (Foley)   LOCAL MEDICATIONS USED:  MARCAINE   , BUPIVICAINE  and Amount: 20 ml  SPECIMEN:  No Specimen  DISPOSITION OF SPECIMEN:  N/A  COUNTS:  YES  TOURNIQUET:  * No tourniquets in log *  DICTATION: .Dragon Dictation  PLAN OF CARE: Admit for overnight observation  PATIENT DISPOSITION:  PACU - hemodynamically stable.   Delay start of Pharmacological VTE agent (>24hrs) due to surgical blood loss or risk of bleeding: yes

## 2013-05-11 NOTE — Anesthesia Preprocedure Evaluation (Addendum)
Anesthesia Evaluation  Patient identified by MRN, date of birth, ID band Patient awake    Reviewed: Allergy & Precautions, H&P , NPO status , Patient's Chart, lab work & pertinent test results  Airway Mallampati: I TM Distance: >3 FB Neck ROM: Full    Dental  (+) Teeth Intact   Pulmonary  breath sounds clear to auscultation        Cardiovascular Rhythm:Regular Rate:Normal     Neuro/Psych    GI/Hepatic   Endo/Other    Renal/GU      Musculoskeletal   Abdominal (+)  Abdomen: soft.    Peds  Hematology   Anesthesia Other Findings   Reproductive/Obstetrics                          Anesthesia Physical Anesthesia Plan  ASA: I  Anesthesia Plan: General   Post-op Pain Management:    Induction: Intravenous  Airway Management Planned: Oral ETT  Additional Equipment:   Intra-op Plan:   Post-operative Plan: Extubation in OR  Informed Consent: I have reviewed the patients History and Physical, chart, labs and discussed the procedure including the risks, benefits and alternatives for the proposed anesthesia with the patient or authorized representative who has indicated his/her understanding and acceptance.   Dental advisory given  Plan Discussed with: CRNA, Anesthesiologist and Surgeon  Anesthesia Plan Comments:        Anesthesia Quick Evaluation

## 2013-05-11 NOTE — Op Note (Signed)
05/11/2013  9:57 AM  PATIENT:  Robert Deleon  53 y.o. male  MRN: [id number]  OPERATIVE REPORT  PRE-OPERATIVE DIAGNOSIS:  ruptured disc lumbar 4-5, large with cauda equina.  POST-OPERATIVE DIAGNOSIS:  ruptured disc lumbar 4-5, with cauda equina.  PROCEDURE:  Procedure(s): MICRODISCECTOMY LUMBAR LAMINECTOMY  L 4-5 Bilateral, OR Microscope.    SURGEON:  Jessy Oto, MD     ASSISTANT: Rodell Perna, MD   ANESTHESIA:  General, supplemented with local anesthesia exparel 1.3% 10cc and marcaine 1/2% with epi 1/200,000 10cc and marcaine 1/2% plain 10 cc, Dr. Sherren Kerns.    COMPLICATIONS:  None.     DRAINS: Foley to SD removed at the end of the case.  EBL: 50cc   PROCEDURE:The patient was met in the holding area, and the appropriate right Lumbar level L4-5 identified and marked with "x" and my initials for bilateral procedure.The patient was then transported to OR and was placed under general anesthesia without difficulty. The patient received appropriate preoperative antibiotic prophylaxis. The patient after intubation atraumatically was transferred to the operating room table, prone position, Wilson frame, sliding OR table. All pressure points were well padded. The arms in 90-90 well-padded at the elbows. Standard prep with DuraPrep solution lower dorsal spine to the mid sacral segment. Draped in the usual manner iodine Vi-Drape was used. Time-out procedure was called and correct. Incision was made ellipsing upper third of the previous incision scar and extending cranially an additional 2 inches after infiltration with a solution of exparel and Marcaine 2: 1,10 cc. Electrocautery then used to incise down to the lumbodorsal fascia. This was then incised on postop and then the expected spinous process of L4 and L5 and the lower half of L3. Cobb was used to elevate the paralumbar muscles exposing the posterior aspect of the L4 and L5 lamina on the right side and then the left side exposing  the previous laminotomy area at left L4-5 previous surgery. Crosstable radiograph was draped sterilely to the field and used to identify a clamp placed at the spinous process of L4.  Lateral radiograph then identified the Penfield #4 and the retractors at the appropriate level L4-5. Using loupe magnification and head lamp the right inferior aspect of L4 lamina was partially resected with a Leksell rongeur. 4 mm high-speed bur then used to further debride inferior aspect of the lamina on the right side L4 and the medial inferior articular process of L4 approximately 15%. 2 and 3 mm Kerrison was then used to resect bone along the inferior aspect of the lamina of L4 until the attachment of the ligamentum flavum was identified and detached. And nerve hook then used to grasp the cranial edge of the ligamentum flavum and the flava was then resected using a 3 mm Kerrison. The left previous laminotomy site at L4-5 was then carefully exposed using a 3 mm Kerrison to resect a small portion of the inferior aspect of the lamina of L4 on the left side and then continuing along the medial aspect of the left L4-5 facet. This freed the old scar tissue and allowed for exposure of some residual ligamentum flavum and its attachment to the  residual left L4 lamina. The operating room microscope sterilely draped brought into the field. Under the operating room microscope, the right L4-5 interspace carefully debrided the small amount of muscle attachment here and high-speed bur used to drill the medial aspect of the inferior articular process of L4 approximately 10%. Ligamentum flavum was then resected off  of the medial aspect of the right L4-5 facet and a foraminotomy was then performed over the right L5 nerve root. The medial 10% superior articular process of L5 then resected using 2 mm Kerrison. This allowed for identification of the thecal sac. Penfield 4 was then used to carefully mobilize the thecal sac medially and the L5 nerve  root identified within the lateral recess flattened over the posterior aspect of the herniated disc. Carefully the lateral aspect of the L5 nerve root was identified and a Penfield 4 was used to mobilize the nerve medially such that the herniated disc was visible with microscope. Using a Penfield 4 for retraction and nerve hook was used to free multiple fragments of disc material that were easily identified on the right lateral recess of L4-5 and ventral to the thecal sac. Further disc material immediately extruded and this was removed using micropituitary rongeurs and nerve hook nerve root and then more easily able to be mobilized medially and retracted using a love retractor. Further foraminotomies was performed over the L5 nerve root the nerve root was noted to be free without further compression. The L5 nerve root able to be retracted along the medial aspect of the L5 pedicle and disc material found to be extruded at this level was further resected current pituitary rongeurs. With this then the disc space at L4-5 was easily visualized. No opening in the disc noted on the right side L4-5. Gelfoam thrombin-soaked placed along the superior medial aspect of the L5 pedicle controlling some small venous bleeders here. Bipolar electrocautery used to cauterize small epidural veins along the posterior aspect of the disc on the right side.  Attention then turned to the left previous surgical laminotomy site. Here the ligamentum flavum was resected away from the ventral aspect of the inferior aspect of the residual L4 lamina. Nerve hook used to grasp the superior edge of this ligamentum flavum and then the flava was debrided using 3 mm Kerrisons. Reflected portions of the palm were resected off the medial border of the left L4-5 facet. The interval between the medial margin of the midportion of the facet and the thecal sac laterally was then identified with Penfield 4 and the left L4-5 disc identified. The thecal sac  retracted along the left side and the disc noted to be herniated with additional free fragments extending superior on the left side above the disc space. Nerve hooks and pitui at the 45 level. The disc space was entered and debrided of degenerative disc material and small amounts on the left side to the opening found to be present on the left side here. Following this then irrigation was carried out careful exploration of the left side laminotomy site with first the blunt tip nerve hook and then hockey-stick nerve probe showed a small residual fragment extending superiorly and then after resection of this there was no further fragments found extending out of the disc space along the left side are within the midline extending along the posterior aspect of the vertebral body of L4. Irrigation was carried out hemostasis obtained using bipolar electrocautery to small veins on the left side. Hockey-stick nerve probe could be passed out the left L4 neuroforamen without difficulty. The L5 nerve root noted to be normal. Following irrigation of the left side than attention returned to the right laminectomy site where the thecal sac was carefully retracted the small portion of disc material found to have been pushed to the right open side this was removed using pituitary  rongeurs. Hemostasis obtained removing Gelfoam present and cottonoids. Small amount of bipolar electrocautery and some small epidural veins and hemostasis was obtained. Careful inspection of the right side with hockey-stick nerve probe demonstrated no further disc fragments extending over the posterior aspect of disc were superiorly along the posterior aspect of the L4 vertebral body. Hockey-stick nerve probe could be passed out both the L4 neuroforamen the right L5 neuroforamen without difficulty. Following irrigation and then the retractors were removed and the lumbodorsal fascia reapproximated in the midline with interrupted #1 Vicryl sutures deep  subcutaneous layers approximated with interrupted 0 Vicryl sutures more superficial layers with interrupted 2-0 Vicryl sutures. The skin closed with a running subcutaneous stitch of 4-0 Vicryl. Dermabond was applied allowed to dry and then Mepilex bandage applied. Patient was then carefully returned to supine position on a stretcher, reactivated and extubated. He was then returned to recovery room in satisfactory condition.  Dr Rodell Perna  perform the duties of assistant surgeon during this case. He was present from the beginning of the case to the end of the case assisting in transfer the patient from his stretcher to the OR table and back to the stretcher at the end of the case. Assisted in careful retraction and suction of the laminectomy site delicate neural structures operating under the operating room microscope. He performed careful left-sided partial hemilaminectomy at the previous surgery hemilaminectomy site and resected herniated disc material from the left posterior L4-5 level.      NITKA,JAMES E 05/11/2013, 9:57 AM

## 2013-05-11 NOTE — Transfer of Care (Signed)
Immediate Anesthesia Transfer of Care Note  Patient: Robert Deleon  Procedure(s) Performed: Procedure(s): MICRODISCECTOMY LUMBAR LAMINECTOMY  L 4-5 (Bilateral)  Patient Location: PACU  Anesthesia Type:General  Level of Consciousness: awake, alert  and oriented  Airway & Oxygen Therapy: Patient Spontanous Breathing  Post-op Assessment: Report given to PACU RN  Post vital signs: Reviewed and stable  Complications: No apparent anesthesia complications

## 2013-05-11 NOTE — Anesthesia Postprocedure Evaluation (Signed)
  Anesthesia Post-op Note  Patient: Robert Deleon  Procedure(s) Performed: Procedure(s): MICRODISCECTOMY LUMBAR LAMINECTOMY  L 4-5 (Bilateral)  Patient Location: PACU  Anesthesia Type:General  Level of Consciousness: awake, alert , oriented and patient cooperative  Airway and Oxygen Therapy: Patient Spontanous Breathing  Post-op Pain: mild  Post-op Assessment: Post-op Vital signs reviewed, Patient's Cardiovascular Status Stable, Respiratory Function Stable, Patent Airway, No signs of Nausea or vomiting and Pain level controlled  Post-op Vital Signs: stable  Complications: No apparent anesthesia complications

## 2013-05-12 NOTE — Evaluation (Addendum)
Physical Therapy Evaluation Patient Details Name: Robert Deleon MRN: [id number] DOB: 12-Sep-1960 Today's Date: 05/12/2013 Time: 2703-5009 PT Time Calculation (min): 24 min  PT Assessment / Plan / Recommendation History of Present Illness  HPI:53 year old male with history of multiple lumbar surgeries in the past,1996, 1998 and 2007. Has been followed for persisting pain; now s/p lami  Clinical Impression  Patient evaluated by Physical Therapy with no further acute PT needs identified. All education has been completed and the patient has no further questions.  See below for any follow-up Physical Therapy or equipment needs. PT is signing off. Thank you for this referral  No OT needs identified; discussed this with pt, wife, and OT     PT Assessment  All further PT needs can be met in the next venue of care    Follow Up Recommendations  Outpatient PT;Supervision/Assistance - 24 hour The potential need for Outpatient PT can be addressed at Ortho follow-up appointments.     Does the patient have the potential to tolerate intense rehabilitation      Barriers to Discharge        Equipment Recommendations  None recommended by PT    Recommendations for Other Services     Frequency      Precautions / Restrictions Precautions Precautions:  (Back prec for comfort)   Pertinent Vitals/Pain 5/10 back pain; MD aware       Mobility  Transfers Overall transfer level: Needs assistance Equipment used: Rolling walker (2 wheeled) Transfers: Sit to/from Stand Sit to Stand: Supervision General transfer comment: Cues for safety, hand placement Ambulation/Gait Ambulation/Gait assistance: Supervision Ambulation Distance (Feet): 400 Feet Assistive device: Rolling walker (2 wheeled) Gait Pattern/deviations: Steppage General Gait Details: Noted heavy dependence on RW for stability; Foot drop R especially with occasional toe drag Stairs: Yes Stairs assistance: Min guard Stair  Management: No rails;Forwards;With walker Number of Stairs: 1 General stair comments: Overall managing well    Exercises     PT Diagnosis: Difficulty walking  PT Problem List: Decreased strength;Decreased mobility;Decreased coordination;Pain PT Treatment Interventions:       PT Goals(Current goals can be found in the care plan section) Acute Rehab PT Goals Patient Stated Goal: for foot dropto improve PT Goal Formulation: No goals set, d/c therapy  Visit Information  Last PT Received On: 05/12/13 Assistance Needed: +1 History of Present Illness: HPI:53 year old male with history of multiple lumbar surgeries in the past,1996, 1998 and 2007. Has been followed for persisting pain       Prior [place] expects to be discharged to:: Private residence Living Arrangements: Spouse/significant other Available Help at Discharge: Family;Available PRN/intermittently Type of Home: House Home Access: Stairs to enter [employer] of Steps: 1 Entrance Stairs-Rails: None Home Layout: One level Home Equipment: Environmental consultant - 2 wheels Prior Function Level of Independence: Independent Communication Communication: No difficulties    Cognition  Cognition Arousal/Alertness: Awake/alert Behavior During Therapy: WFL for tasks assessed/performed Overall Cognitive Status: Within Functional Limits for tasks assessed    Extremity/Trunk Assessment Upper Extremity Assessment Upper Extremity Assessment: Overall WFL for tasks assessed Lower Extremity Assessment Lower Extremity Assessment:  (Bilateral foot drop, R worse than L)   Balance General Comments General comments (skin integrity, edema, etc.): Lengthy discussion rE what to expect with AFO  End of Session PT - End of Session Activity Tolerance: Patient tolerated treatment well Patient left: Other (comment) (in room with wife) Nurse Communication: Mobility status (OK fo rdc)  GP  Functional Assessment Tool  Used: Clinical Judgement Functional Limitation: Mobility: Walking and moving around Mobility: Walking and Moving Around Current Status [phone]): At least 1 percent but less than 20 percent impaired, limited or restricted Mobility: Walking and Moving Around Goal Status [phone]): 0 percent impaired, limited or restricted Mobility: Walking and Moving Around Discharge Status [phone]): At least 1 percent but less than 20 percent impaired, limited or restricted   Roney Marion [facility] [place], [place]  05/12/2013, 1:17 PM

## 2013-05-12 NOTE — Discharge Summary (Signed)
Patient ID: Robert Deleon MRN: [id number] DOB/AGE: 09-26-1960 53 y.o.  Admit date: 05/11/2013 Discharge date: 05/12/2013  Admission Diagnoses:  Principal Problem:   HNP (herniated nucleus pulposus), lumbar Active Problems:   Cauda equina syndrome not affecting bladder   Herniated nucleus pulposus, lumbar   Discharge Diagnoses:  Same  Past Medical History  Diagnosis Date  . Back pain     Surgeries: Procedure(s): MICRODISCECTOMY LUMBAR LAMINECTOMY  L 4-5 on 05/11/2013   Consultants:    Discharged Condition: Improved  Hospital Course: Robert Deleon is an 53 y.o. male who was admitted 05/11/2013 for operative treatment ofHNP (herniated nucleus pulposus), lumbar. Patient has severe unremitting pain that affects sleep, daily activities, and work/hobbies. After pre-op clearance the patient was taken to the operating room on 05/11/2013 and underwent  Procedure(s): MICRODISCECTOMY LUMBAR LAMINECTOMY  L 4-5.    Patient was given perioperative antibiotics: Anti-infectives   Start     Dose/Rate Route Frequency Ordered Stop   05/11/13 1600  ceFAZolin (ANCEF) IVPB 1 g/50 mL premix     1 g 100 mL/hr over 30 Minutes Intravenous Every 8 hours 05/11/13 1348 05/12/13 0122   05/11/13 0703  ceFAZolin (ANCEF) 2-3 GM-% IVPB SOLR    Comments:  Deleon, Robert   : cabinet override      05/11/13 0703 05/11/13 1914   05/11/13 0700  ceFAZolin (ANCEF) IVPB 2 g/50 mL premix     2 g 100 mL/hr over 30 Minutes Intravenous On call to O.R. 05/11/13 0539 05/11/13 0758       Patient was given sequential compression devices, early ambulation, and chemoprophylaxis to prevent DVT.  Patient benefited maximally from hospital stay and there were no complications.    Recent vital signs: Patient Vitals for the past 24 hrs:  BP Temp Temp src Pulse Resp SpO2  05/12/13 0628 141/70 mmHg 98.1 F (36.7 C) Oral 68 18 98 %  05/11/13 2139 141/82 mmHg 98 F (36.7 C) - 68 17 95 %  05/11/13 1342 132/70 mmHg  98.5 F (36.9 C) - 65 18 97 %  05/11/13 1330 - - - - - 100 %  05/11/13 1115 - 98.1 F (36.7 C) - 63 16 100 %  05/11/13 1100 145/83 mmHg - - 64 16 100 %  05/11/13 1045 143/77 mmHg - - 72 14 100 %  05/11/13 1030 127/76 mmHg - - 66 12 100 %  05/11/13 1015 131/82 mmHg - - 69 14 97 %  05/11/13 1003 137/77 mmHg 98 F (36.7 C) - - 16 -     Recent laboratory studies:  Recent Labs  05/11/13 0723  WBC 9.0  HGB 16.1  HCT 46.6  PLT 293  NA 139  K 4.2  CL 103  CO2 22  BUN 20  CREATININE 1.02  GLUCOSE 95  CALCIUM 9.7     Discharge Medications:     Medication List         metaxalone 800 MG tablet  Commonly known as:  SKELAXIN  Take 1 tablet (800 mg total) by mouth 3 (three) times daily.     methocarbamol 750 MG tablet  Commonly known as:  ROBAXIN  Take 750 mg by mouth every 6 (six) hours.     oxyCODONE-acetaminophen 7.5-325 MG per tablet  Commonly known as:  PERCOCET  Take 1 tablet by mouth every 4 (four) hours as needed for pain.     ranitidine 150 MG tablet  Commonly known as:  ZANTAC  Take 150 mg by  mouth 2 (two) times daily.        Diagnostic Studies: Dg Lumbar Spine 2-3 Views  05/11/2013   CLINICAL DATA:  Disc herniation  EXAM: LUMBAR SPINE - 2-3 VIEW  COMPARISON:  MR L SPINE WO/W CM dated 05/10/2013  FINDINGS: On the initial image, a surgical instrument projects over the L4 spinous process.  On the second image, a surgical instrument projects over the posterior elements at the L5 pedicle level.  No vertebral compression deformity. Moderate narrowing of the L4-5 disc. Severe narrowing at L5-S1.  IMPRESSION: Intraoperative localization at L4-5.   Electronically Signed   By: Robert Deleon M.D.   On: 05/11/2013 15:57   Dg Lumbar Spine Complete  05/06/2013   CLINICAL DATA:  Back pain.  EXAM: LUMBAR SPINE - COMPLETE 4+ VIEW  COMPARISON:  None.  FINDINGS: Degenerative disc disease changes at L5-S1 and L2-3 with disc space narrowing and endplate spurring. Mild facet disease in  the mid and lower lumbar spine. Normal alignment. No fracture. SI joints are symmetric and unremarkable.  Calcification projects over the midpole of the left kidney, likely nonobstructing stone measuring up to 7 mm.  IMPRESSION: Degenerative changes in the lumbar spine. No acute bony abnormality.  Left nephrolithiasis.   Electronically Signed   By: Robert Deleon M.D.   On: 05/06/2013 02:26   Mr Lumbar Spine W Wo Contrast  05/10/2013   CLINICAL DATA:  Central and right-sided low back pain with bilateral leg and foot pain, weakness and numbness. History of 4 microdiscectomies. Recent lifting injury.  EXAM: MRI LUMBAR SPINE WITHOUT AND WITH CONTRAST  TECHNIQUE: Multiplanar and multiecho pulse sequences of the lumbar spine were obtained without and with intravenous contrast.  CONTRAST:  59mL MULTIHANCE GADOBENATE DIMEGLUMINE 529 MG/ML IV SOLN  COMPARISON:  DG LUMBAR SPINE COMPLETE dated 05/06/2013; MR L SPINE WO/W CM dated 05/08/2009  FINDINGS: There is straightening of the normal lumbar lordosis, unchanged. There is no listhesis. There is moderate disc space narrowing at L2-3 with adjacent degenerative marrow changes, unchanged. Severe disc space narrowing and mild degenerative endplate changes at 075-GRM are stable. There is progressive disc space height loss at L4-5. The conus medullaris terminates at T12. Visualized retroperitoneal soft tissues are unremarkable.  L1-2:  Negative.  L2-3: Prior left hemilaminectomy. Disc bulge eccentric to the left results in mild to moderate left neural foraminal stenosis, unchanged. No spinal canal stenosis.  L3-4: Mild disc bulge and facet and ligamentum flavum hypertrophy result in mild spinal canal narrowing, mildly increased from prior. No neural foraminal stenosis.  L4-5: There is a new, very large left central disc extrusion with cranial migration to the superior L4 vertebral body level. This disc extrusion completely fills the spinal canal completely effacing the thecal sac.  Enhancement is noted at the margins of the extruded disc. No neural foraminal stenosis. Mild facet hypertrophy. Prior left hemilaminectomy.  L5-S1: Postsurgical changes. Left subarticular/foraminal endplate osteophyte without spinal canal or neural foraminal stenosis, unchanged.  IMPRESSION: 1. New, very large disc extrusion at L4-5 resulting in severe spinal canal stenosis. 2. Unchanged mild-to-moderate left neural foraminal stenosis at L2-3.   Electronically Signed   By: Logan Bores   On: 05/10/2013 16:11    Disposition: 01-Home or Self Care      Discharge Orders   Future Orders Complete By Expires   Call MD / Call 911  As directed    Comments:     If you experience chest pain or shortness of breath, CALL 911  and be transported to the hospital emergency room.  If you develope a fever above 101 F, pus (white drainage) or increased drainage or redness at the wound, or calf pain, call your surgeon's office.   Call MD / Call 911  As directed    Comments:     If you experience chest pain or shortness of breath, CALL 911 and be transported to the hospital emergency room.  If you develope a fever above 101 F, pus (white drainage) or increased drainage or redness at the wound, or calf pain, call your surgeon's office.   Constipation Prevention  As directed    Comments:     Drink plenty of fluids.  Prune juice may be helpful.  You may use a stool softener, such as Colace (over the counter) 100 mg twice a day.  Use MiraLax (over the counter) for constipation as needed.   Constipation Prevention  As directed    Comments:     Drink plenty of fluids.  Prune juice may be helpful.  You may use a stool softener, such as Colace (over the counter) 100 mg twice a day.  Use MiraLax (over the counter) for constipation as needed.   Diet - low sodium heart healthy  As directed    Diet - low sodium heart healthy  As directed    Discharge instructions  As directed    Comments:     No lifting greater than 10  lbs. Avoid bending, stooping and twisting. Walk in house for first week them may start to get out slowly increasing distance up to one mile by 3 weeks post op. Keep incision dry for 3 days, may use tegaderm or similar water impervious dressing.   Discharge patient  As directed    Driving restrictions  As directed    Comments:     No driving for 2 weeks   Increase activity slowly as tolerated  As directed    Increase activity slowly as tolerated  As directed    Lifting restrictions  As directed    Comments:     No lifting for 6 weeks      Follow-up Information   Follow up with NITKA,JAMES E, MD In 2 weeks.   Specialty:  Orthopedic Surgery   Contact information:   [place] [address] [phone]        Signed: Mcarthur Rossetti 05/12/2013, 7:55 AM

## 2013-05-12 NOTE — Progress Notes (Signed)
Subjective: 1 Day Post-Op Procedure(s) (LRB): MICRODISCECTOMY LUMBAR LAMINECTOMY  L 4-5 (Bilateral) Patient reports pain as moderate.  Reports numbness in bil LE R>L and RLE weakness that was present pre-op.  States he is doing well all things considering.  Has had experience with back surgery.  Objective: Vital signs in last 24 hours: Temp:  [98 F (36.7 C)-98.5 F (36.9 C)] 98.1 F (36.7 C) (02/22 0628) Pulse Rate:  [63-72] 68 (02/22 0628) Resp:  [12-18] 18 (02/22 0628) BP: (127-145)/(70-83) 141/70 mmHg (02/22 0628) SpO2:  [95 %-100 %] 98 % (02/22 0628)  Intake/Output from previous day: 02/21 0701 - 02/22 0700 In: 2130 [P.O.:480; I.V.:1600; IV Piggyback:50] Out: 200 [Urine:100; Blood:100] Intake/Output this shift: Total I/O In: 480 [P.O.:480] Out: -    Recent Labs  05/11/13 0723  HGB 16.1    Recent Labs  05/11/13 0723  WBC 9.0  RBC 5.35  HCT 46.6  PLT 293    Recent Labs  05/11/13 0723  NA 139  K 4.2  CL 103  CO2 22  BUN 20  CREATININE 1.02  GLUCOSE 95  CALCIUM 9.7   No results found for this basename: LABPT, INR,  in the last 72 hours  Intact pulses distally Incision: dressing C/D/I Foot drop right with decreased sensation. Better strength on LLE  Assessment/Plan: 1 Day Post-Op Procedure(s) (LRB): MICRODISCECTOMY LUMBAR LAMINECTOMY  L 4-5 (Bilateral) Discharge to home today  Mcarthur Rossetti 05/12/2013, 7:52 AM

## 2013-05-13 ENCOUNTER — Encounter (HOSPITAL_COMMUNITY): Payer: Self-pay | Admitting: Specialist

## 2015-07-14 ENCOUNTER — Ambulatory Visit (HOSPITAL_BASED_OUTPATIENT_CLINIC_OR_DEPARTMENT_OTHER): Payer: BLUE CROSS/BLUE SHIELD | Attending: Physical Medicine and Rehabilitation | Admitting: Internal Medicine

## 2015-07-14 DIAGNOSIS — G4700 Insomnia, unspecified: Secondary | ICD-10-CM | POA: Insufficient documentation

## 2015-07-14 DIAGNOSIS — G4733 Obstructive sleep apnea (adult) (pediatric): Secondary | ICD-10-CM | POA: Insufficient documentation

## 2015-07-22 ENCOUNTER — Other Ambulatory Visit (HOSPITAL_BASED_OUTPATIENT_CLINIC_OR_DEPARTMENT_OTHER): Payer: Self-pay

## 2015-07-22 DIAGNOSIS — G4733 Obstructive sleep apnea (adult) (pediatric): Secondary | ICD-10-CM

## 2015-07-22 DIAGNOSIS — G4700 Insomnia, unspecified: Secondary | ICD-10-CM

## 2015-07-25 DIAGNOSIS — G4700 Insomnia, unspecified: Secondary | ICD-10-CM

## 2015-07-25 DIAGNOSIS — G4733 Obstructive sleep apnea (adult) (pediatric): Secondary | ICD-10-CM

## 2015-07-25 NOTE — Procedures (Signed)
   Patient Name: Robert Deleon, Robert Deleon Date: 07/14/2015 Gender: Male D.O.B: 11/07/1960 Age (years): 28 Referring Provider: Margaretha Sheffield Height (inches): 43 Interpreting Physician: Baird Lyons MD, ABSM Weight (lbs): 188 RPSGT: Jacolyn Reedy BMI: 27 MRN: [id number] Neck Size: 17.00 CLINICAL INFORMATION Sleep Study Type: unattended Home Sleep Test   Indication for sleep study: OSA   Epworth Sleepiness Score: 3  SLEEP STUDY TECHNIQUE A multi-channel overnight portable sleep study was performed. The channels recorded were: nasal airflow, thoracic respiratory movement, and oxygen saturation with a pulse oximetry. Snoring was also monitored.  MEDICATIONS Patient self administered medications include: no meds reported during study.  SLEEP ARCHITECTURE Patient was studied for 435.9 minutes. The sleep efficiency was 98.3 % and the patient was supine for 21.5%. The arousal index was 0.0 per hour.  RESPIRATORY PARAMETERS The overall AHI was 22.2 per hour, with a central apnea index of 0.0 per hour. The oxygen nadir was 83% during sleep.  CARDIAC DATA Mean heart rate during sleep was 59.7 bpm.  IMPRESSIONS - Moderate obstructive sleep apnea occurred during this study (AHI = 22.2/h). - No significant central sleep apnea occurred during this study (CAI = 0.0/h). - Moderate oxygen desaturation was noted during this study (Min O2 = 83%). - Patient snored   DIAGNOSIS - Obstructive Sleep Apnea (327.23 [G47.33 ICD-10])  RECOMMENDATIONS - CPAP titration to determine therapeutic pressure. - Positional therapy avoiding supine position during sleep. - Avoid alcohol, sedatives and other CNS depressants that may worsen sleep apnea and disrupt normal sleep architecture. - Sleep hygiene should be reviewed to assess factors that may improve sleep quality. - Weight management and regular exercise should be initiated or continued.  Deneise Lever Diplomate, American Board of  Sleep Medicine  ELECTRONICALLY SIGNED ON:  07/25/2015, 3:40 PM [place] PH: [phone]   FX: [phone] [place]

## 2015-08-10 ENCOUNTER — Encounter: Payer: Self-pay | Admitting: Pulmonary Disease

## 2015-10-20 ENCOUNTER — Institutional Professional Consult (permissible substitution): Payer: Self-pay | Admitting: Pulmonary Disease

## 2015-10-29 ENCOUNTER — Encounter: Payer: Self-pay | Admitting: Pulmonary Disease

## 2015-10-29 ENCOUNTER — Ambulatory Visit (INDEPENDENT_AMBULATORY_CARE_PROVIDER_SITE_OTHER): Payer: BLUE CROSS/BLUE SHIELD | Admitting: Pulmonary Disease

## 2015-10-29 VITALS — BP 138/92 | HR 74 | Ht 70.0 in | Wt 206.2 lb

## 2015-10-29 DIAGNOSIS — Z9989 Dependence on other enabling machines and devices: Principal | ICD-10-CM

## 2015-10-29 DIAGNOSIS — G4733 Obstructive sleep apnea (adult) (pediatric): Secondary | ICD-10-CM

## 2015-10-29 NOTE — Progress Notes (Signed)
   Subjective:    Patient ID: Robert Deleon, male    DOB: 03-Mar-1961, 55 y.o.   MRN: [id number]  HPI    Review of Systems  Constitutional: Negative for fever and unexpected weight change.  HENT: Negative for congestion, dental problem, ear pain, nosebleeds, postnasal drip, rhinorrhea, sinus pressure, sneezing, sore throat and trouble swallowing.   Eyes: Negative for redness and itching.  Respiratory: Positive for shortness of breath. Negative for cough, chest tightness and wheezing.   Cardiovascular: Negative for palpitations and leg swelling.  Gastrointestinal: Negative for nausea and vomiting.       Acid heartburn / Indigestion  Genitourinary: Negative for dysuria.  Musculoskeletal: Negative for joint swelling.  Skin: Negative for rash.  Neurological: Negative for headaches.  Hematological: Does not bruise/bleed easily.  Psychiatric/Behavioral: Negative for dysphoric mood. The patient is not nervous/anxious.        Objective:   Physical Exam        Assessment & Plan:

## 2015-10-29 NOTE — Patient Instructions (Signed)
Will arrange for CPAP set up  Follow up in 3 months 

## 2015-10-29 NOTE — Progress Notes (Signed)
Past Surgical History He  has a past surgical history that includes Back surgery; Hernia repair; and Lumbar laminectomy (Bilateral, 05/11/2013).  No Known Allergies  Family History His family history includes Allergies in his mother; Breast cancer in his mother; Lung cancer in his father; Skin cancer in his father.  Social History He  reports that he has never smoked. He has never used smokeless tobacco. He reports that he drinks alcohol. He reports that he does not use drugs.  Review of systems  Constitutional: Negative for fever and unexpected weight change.  HENT: Negative for congestion, dental problem, ear pain, nosebleeds, postnasal drip, rhinorrhea, sinus pressure, sneezing, sore throat and trouble swallowing.   Eyes: Negative for redness and itching.  Respiratory: Positive for shortness of breath. Negative for cough, chest tightness and wheezing.   Cardiovascular: Negative for palpitations and leg swelling.  Gastrointestinal: Negative for nausea and vomiting.       Acid heartburn / Indigestion  Genitourinary: Negative for dysuria.  Musculoskeletal: Negative for joint swelling.  Skin: Negative for rash.  Neurological: Negative for headaches.  Hematological: Does not bruise/bleed easily.  Psychiatric/Behavioral: Negative for dysphoric mood. The patient is not nervous/anxious.     Current Outpatient Prescriptions on File Prior to Visit  Medication Sig  . methocarbamol (ROBAXIN) 750 MG tablet Take 750 mg by mouth every 6 (six) hours.  Marland Kitchen oxyCODONE-acetaminophen (PERCOCET) 7.5-325 MG per tablet Take 1 tablet by mouth every 4 (four) hours as needed for pain.  . ranitidine (ZANTAC) 150 MG tablet Take 150 mg by mouth daily.    No current facility-administered medications on file prior to visit.     Chief Complaint  Patient presents with  . Sleep Consult    Referred by Dr Greta Doom. Recent HST 07/2015. Epworth Score:4    Tests: PSG 07/14/15 >> AHI 22.2, SpO2 low 83%  Past medical  history He  has a past medical history of Back pain.  Vital signs BP (!) 138/92 (BP Location: Left Arm, Cuff Size: Normal)   Pulse 74   Ht 5\' 10"  (1.778 m)   Wt 206 lb 3.2 oz (93.5 kg)   SpO2 97%   BMI 29.59 kg/m   History of Present Illness Robert Deleon is a 55 y.o. male for evaluation of sleep problems.  He has chronic back pain.  He is followed in pain clinic.  He had screening questionnaire and he was felt to be high risk for sleep apnea.  He had sleep study in April 2017 >> showed moderate sleep apnea.    He snores some.  He is not aware of apnea.  He wakes up frequently due to back pain issues.    He goes to sleep at 11 pm.  He falls asleep 20 minutes.  He wakes up some times to use the bathroom.  He gets out of bed at 6 am.  He feels okay in the morning.  He denies morning headache.  He does not use anything to help him fall sleep.  He drinks several cups of coffee in the morning.  He denies sleep walking, sleep talking, bruxism, or nightmares.  There is no history of restless legs.  He denies sleep hallucinations, sleep paralysis, or cataplexy.  The Epworth score is 4 out of 24.   Physical Exam:  General - No distress ENT - No sinus tenderness, no oral exudate, no LAN, no thyromegaly, TM clear, pupils equal/reactive, MP 3 Cardiac - s1s2 regular, no murmur, pulses symmetric Chest - No  wheeze/rales/dullness, good air entry, normal respiratory excursion Back - No focal tenderness Abd - Soft, non-tender, no organomegaly, + bowel sounds Ext - No edema Neuro - Normal strength, cranial nerves intact Skin - No rashes Psych - Normal mood, and behavior  Discussion: He had recent sleep study that shows moderate obstructive sleep apnea.  We discussed how sleep apnea can affect various health problems, including risks for hypertension, cardiovascular disease, and diabetes.  We also discussed how sleep disruption can increase risks for accidents, such as while driving.   Weight loss as a means of improving sleep apnea was also reviewed.  Additional treatment options discussed were CPAP therapy, oral appliance, and surgical intervention.  Assessment/plan:  Obstructive sleep apnea. - will arrange for auto CPAP set up - if he is not able to adjust to CPAP, then he might be a suitable candidate for an oral appliance   Patient Instructions  Will arrange for CPAP set up  Follow up in 3 months    Chesley Mires, M.D. Pager [phone] 10/29/2015, 4:12 PM

## 2015-11-02 ENCOUNTER — Telehealth: Payer: Self-pay | Admitting: Pulmonary Disease

## 2015-11-02 NOTE — Telephone Encounter (Signed)
Opened in error. Closing encounter.

## 2015-12-01 ENCOUNTER — Telehealth: Payer: Self-pay | Admitting: Pulmonary Disease

## 2015-12-01 NOTE — Telephone Encounter (Signed)
Spoke with Amy @ [employer] and she states that pt has very high deductible and out of pocket cost. It will end up costing him over $2400.   Spoke with pt's wife and gave information about [employer] and website information to apply for reduced cost CPAP and mask. She will go to website and let us know if they need any more assistance. Nothing further needed at this time.

## 2015-12-18 ENCOUNTER — Telehealth: Payer: Self-pay | Admitting: Pulmonary Disease

## 2015-12-18 DIAGNOSIS — G4733 Obstructive sleep apnea (adult) (pediatric): Secondary | ICD-10-CM

## 2015-12-18 NOTE — Telephone Encounter (Signed)
lmtcb

## 2015-12-22 NOTE — Telephone Encounter (Signed)
Called and spoke with pts wife and she stated that [facility] was way too expensive and they want to [facility] [email or web address] to get the cpap and supplies.  She is requesting that we send the new rx for the cpap and supplies to this company.  VS please advise. thanks

## 2015-12-27 NOTE — Telephone Encounter (Signed)
Okay to send script for auto CPAP range 5 to 15 cm H2O with heated humidity to [email or web address].

## 2015-12-28 NOTE — Telephone Encounter (Signed)
Order placed for CPAP machine and mask. Will be sent to [email or web address]. Nothing further needed.

## 2016-01-29 ENCOUNTER — Ambulatory Visit: Payer: BLUE CROSS/BLUE SHIELD | Admitting: Pulmonary Disease

## 2016-04-12 ENCOUNTER — Ambulatory Visit: Payer: BLUE CROSS/BLUE SHIELD | Admitting: Pulmonary Disease

## 2018-02-28 DIAGNOSIS — M62830 Muscle spasm of back: Secondary | ICD-10-CM | POA: Diagnosis not present

## 2018-02-28 DIAGNOSIS — G4733 Obstructive sleep apnea (adult) (pediatric): Secondary | ICD-10-CM | POA: Diagnosis not present

## 2018-02-28 DIAGNOSIS — M5416 Radiculopathy, lumbar region: Secondary | ICD-10-CM | POA: Diagnosis not present

## 2018-02-28 DIAGNOSIS — Z79891 Long term (current) use of opiate analgesic: Secondary | ICD-10-CM | POA: Diagnosis not present

## 2018-02-28 DIAGNOSIS — G894 Chronic pain syndrome: Secondary | ICD-10-CM | POA: Diagnosis not present

## 2018-04-30 DIAGNOSIS — G4733 Obstructive sleep apnea (adult) (pediatric): Secondary | ICD-10-CM | POA: Diagnosis not present

## 2018-04-30 DIAGNOSIS — M62830 Muscle spasm of back: Secondary | ICD-10-CM | POA: Diagnosis not present

## 2018-04-30 DIAGNOSIS — G894 Chronic pain syndrome: Secondary | ICD-10-CM | POA: Diagnosis not present

## 2018-04-30 DIAGNOSIS — M5416 Radiculopathy, lumbar region: Secondary | ICD-10-CM | POA: Diagnosis not present

## 2018-06-25 DIAGNOSIS — G894 Chronic pain syndrome: Secondary | ICD-10-CM | POA: Diagnosis not present

## 2018-06-25 DIAGNOSIS — M62830 Muscle spasm of back: Secondary | ICD-10-CM | POA: Diagnosis not present

## 2018-06-25 DIAGNOSIS — M5416 Radiculopathy, lumbar region: Secondary | ICD-10-CM | POA: Diagnosis not present

## 2018-06-25 DIAGNOSIS — G4733 Obstructive sleep apnea (adult) (pediatric): Secondary | ICD-10-CM | POA: Diagnosis not present

## 2018-08-20 DIAGNOSIS — M5416 Radiculopathy, lumbar region: Secondary | ICD-10-CM | POA: Diagnosis not present

## 2018-08-20 DIAGNOSIS — M62830 Muscle spasm of back: Secondary | ICD-10-CM | POA: Diagnosis not present

## 2018-08-20 DIAGNOSIS — G4733 Obstructive sleep apnea (adult) (pediatric): Secondary | ICD-10-CM | POA: Diagnosis not present

## 2018-08-20 DIAGNOSIS — G894 Chronic pain syndrome: Secondary | ICD-10-CM | POA: Diagnosis not present

## 2018-10-15 DIAGNOSIS — G894 Chronic pain syndrome: Secondary | ICD-10-CM | POA: Diagnosis not present

## 2018-10-15 DIAGNOSIS — G4733 Obstructive sleep apnea (adult) (pediatric): Secondary | ICD-10-CM | POA: Diagnosis not present

## 2018-10-15 DIAGNOSIS — M5416 Radiculopathy, lumbar region: Secondary | ICD-10-CM | POA: Diagnosis not present

## 2018-10-15 DIAGNOSIS — M62830 Muscle spasm of back: Secondary | ICD-10-CM | POA: Diagnosis not present

## 2018-12-10 DIAGNOSIS — Z79891 Long term (current) use of opiate analgesic: Secondary | ICD-10-CM | POA: Diagnosis not present

## 2018-12-10 DIAGNOSIS — M62830 Muscle spasm of back: Secondary | ICD-10-CM | POA: Diagnosis not present

## 2018-12-10 DIAGNOSIS — M5416 Radiculopathy, lumbar region: Secondary | ICD-10-CM | POA: Diagnosis not present

## 2018-12-10 DIAGNOSIS — G894 Chronic pain syndrome: Secondary | ICD-10-CM | POA: Diagnosis not present

## 2018-12-10 DIAGNOSIS — G4733 Obstructive sleep apnea (adult) (pediatric): Secondary | ICD-10-CM | POA: Diagnosis not present

## 2019-02-04 DIAGNOSIS — M5416 Radiculopathy, lumbar region: Secondary | ICD-10-CM | POA: Diagnosis not present

## 2019-02-04 DIAGNOSIS — G4733 Obstructive sleep apnea (adult) (pediatric): Secondary | ICD-10-CM | POA: Diagnosis not present

## 2019-02-04 DIAGNOSIS — M62830 Muscle spasm of back: Secondary | ICD-10-CM | POA: Diagnosis not present

## 2019-02-04 DIAGNOSIS — G894 Chronic pain syndrome: Secondary | ICD-10-CM | POA: Diagnosis not present

## 2019-04-01 DIAGNOSIS — G894 Chronic pain syndrome: Secondary | ICD-10-CM | POA: Diagnosis not present

## 2019-04-01 DIAGNOSIS — M5416 Radiculopathy, lumbar region: Secondary | ICD-10-CM | POA: Diagnosis not present

## 2019-04-01 DIAGNOSIS — M62830 Muscle spasm of back: Secondary | ICD-10-CM | POA: Diagnosis not present

## 2019-04-01 DIAGNOSIS — G4733 Obstructive sleep apnea (adult) (pediatric): Secondary | ICD-10-CM | POA: Diagnosis not present

## 2019-05-29 DIAGNOSIS — G894 Chronic pain syndrome: Secondary | ICD-10-CM | POA: Diagnosis not present

## 2019-05-29 DIAGNOSIS — M5416 Radiculopathy, lumbar region: Secondary | ICD-10-CM | POA: Diagnosis not present

## 2019-05-29 DIAGNOSIS — M65321 Trigger finger, right index finger: Secondary | ICD-10-CM | POA: Diagnosis not present

## 2019-05-29 DIAGNOSIS — M62830 Muscle spasm of back: Secondary | ICD-10-CM | POA: Diagnosis not present

## 2019-05-29 DIAGNOSIS — M65331 Trigger finger, right middle finger: Secondary | ICD-10-CM | POA: Diagnosis not present

## 2019-05-29 DIAGNOSIS — G4733 Obstructive sleep apnea (adult) (pediatric): Secondary | ICD-10-CM | POA: Diagnosis not present

## 2019-05-29 DIAGNOSIS — M65341 Trigger finger, right ring finger: Secondary | ICD-10-CM | POA: Diagnosis not present

## 2019-07-22 DIAGNOSIS — Z79891 Long term (current) use of opiate analgesic: Secondary | ICD-10-CM | POA: Diagnosis not present

## 2019-07-22 DIAGNOSIS — M62830 Muscle spasm of back: Secondary | ICD-10-CM | POA: Diagnosis not present

## 2019-07-22 DIAGNOSIS — M5416 Radiculopathy, lumbar region: Secondary | ICD-10-CM | POA: Diagnosis not present

## 2019-07-22 DIAGNOSIS — G4733 Obstructive sleep apnea (adult) (pediatric): Secondary | ICD-10-CM | POA: Diagnosis not present

## 2019-07-22 DIAGNOSIS — G894 Chronic pain syndrome: Secondary | ICD-10-CM | POA: Diagnosis not present

## 2019-07-29 ENCOUNTER — Other Ambulatory Visit: Payer: Self-pay | Admitting: Anesthesiology

## 2019-07-29 ENCOUNTER — Other Ambulatory Visit: Payer: Self-pay | Admitting: Physical Medicine and Rehabilitation

## 2019-07-29 ENCOUNTER — Ambulatory Visit
Admission: RE | Admit: 2019-07-29 | Discharge: 2019-07-29 | Disposition: A | Discharge status: Home / Self Care | Payer: BLUE CROSS/BLUE SHIELD | Source: Ambulatory Visit | Attending: Physical Medicine and Rehabilitation | Admitting: Physical Medicine and Rehabilitation

## 2019-07-29 ENCOUNTER — Other Ambulatory Visit: Payer: Self-pay

## 2019-07-29 DIAGNOSIS — M25552 Pain in left hip: Secondary | ICD-10-CM

## 2019-07-29 DIAGNOSIS — M1612 Unilateral primary osteoarthritis, left hip: Secondary | ICD-10-CM | POA: Diagnosis not present

## 2019-09-16 DIAGNOSIS — M5416 Radiculopathy, lumbar region: Secondary | ICD-10-CM | POA: Diagnosis not present

## 2019-09-16 DIAGNOSIS — G4733 Obstructive sleep apnea (adult) (pediatric): Secondary | ICD-10-CM | POA: Diagnosis not present

## 2019-09-16 DIAGNOSIS — M62830 Muscle spasm of back: Secondary | ICD-10-CM | POA: Diagnosis not present

## 2019-09-16 DIAGNOSIS — G894 Chronic pain syndrome: Secondary | ICD-10-CM | POA: Diagnosis not present

## 2019-11-11 DIAGNOSIS — G894 Chronic pain syndrome: Secondary | ICD-10-CM | POA: Diagnosis not present

## 2019-11-11 DIAGNOSIS — M5416 Radiculopathy, lumbar region: Secondary | ICD-10-CM | POA: Diagnosis not present

## 2019-11-11 DIAGNOSIS — G4733 Obstructive sleep apnea (adult) (pediatric): Secondary | ICD-10-CM | POA: Diagnosis not present

## 2019-11-11 DIAGNOSIS — M62830 Muscle spasm of back: Secondary | ICD-10-CM | POA: Diagnosis not present

## 2019-12-04 DIAGNOSIS — M5416 Radiculopathy, lumbar region: Secondary | ICD-10-CM | POA: Diagnosis not present

## 2019-12-04 DIAGNOSIS — G4733 Obstructive sleep apnea (adult) (pediatric): Secondary | ICD-10-CM | POA: Diagnosis not present

## 2019-12-04 DIAGNOSIS — M654 Radial styloid tenosynovitis [de Quervain]: Secondary | ICD-10-CM | POA: Diagnosis not present

## 2019-12-04 DIAGNOSIS — G894 Chronic pain syndrome: Secondary | ICD-10-CM | POA: Diagnosis not present

## 2019-12-04 DIAGNOSIS — M62830 Muscle spasm of back: Secondary | ICD-10-CM | POA: Diagnosis not present

## 2019-12-23 ENCOUNTER — Ambulatory Visit
Admission: RE | Admit: 2019-12-23 | Discharge: 2019-12-23 | Disposition: A | Discharge status: Home / Self Care | Payer: BC Managed Care – PPO | Source: Ambulatory Visit | Attending: Physical Medicine and Rehabilitation | Admitting: Physical Medicine and Rehabilitation

## 2019-12-23 ENCOUNTER — Other Ambulatory Visit: Payer: Self-pay | Admitting: Physical Medicine and Rehabilitation

## 2019-12-23 DIAGNOSIS — M19031 Primary osteoarthritis, right wrist: Secondary | ICD-10-CM | POA: Diagnosis not present

## 2019-12-23 DIAGNOSIS — M5416 Radiculopathy, lumbar region: Secondary | ICD-10-CM | POA: Diagnosis not present

## 2019-12-23 DIAGNOSIS — M25531 Pain in right wrist: Secondary | ICD-10-CM

## 2019-12-23 DIAGNOSIS — G894 Chronic pain syndrome: Secondary | ICD-10-CM | POA: Diagnosis not present

## 2019-12-23 DIAGNOSIS — M62830 Muscle spasm of back: Secondary | ICD-10-CM | POA: Diagnosis not present

## 2019-12-23 DIAGNOSIS — G4733 Obstructive sleep apnea (adult) (pediatric): Secondary | ICD-10-CM | POA: Diagnosis not present

## 2019-12-24 DIAGNOSIS — G5601 Carpal tunnel syndrome, right upper limb: Secondary | ICD-10-CM | POA: Diagnosis not present

## 2019-12-24 DIAGNOSIS — M5412 Radiculopathy, cervical region: Secondary | ICD-10-CM | POA: Diagnosis not present

## 2020-01-03 DIAGNOSIS — G5601 Carpal tunnel syndrome, right upper limb: Secondary | ICD-10-CM | POA: Diagnosis not present

## 2020-01-16 DIAGNOSIS — Z0000 Encounter for general adult medical examination without abnormal findings: Secondary | ICD-10-CM | POA: Diagnosis not present

## 2020-01-16 DIAGNOSIS — Z23 Encounter for immunization: Secondary | ICD-10-CM | POA: Diagnosis not present

## 2020-01-16 DIAGNOSIS — Z13220 Encounter for screening for lipoid disorders: Secondary | ICD-10-CM | POA: Diagnosis not present

## 2020-02-19 DIAGNOSIS — M62830 Muscle spasm of back: Secondary | ICD-10-CM | POA: Diagnosis not present

## 2020-02-19 DIAGNOSIS — M654 Radial styloid tenosynovitis [de Quervain]: Secondary | ICD-10-CM | POA: Diagnosis not present

## 2020-02-19 DIAGNOSIS — G894 Chronic pain syndrome: Secondary | ICD-10-CM | POA: Diagnosis not present

## 2020-02-19 DIAGNOSIS — G4733 Obstructive sleep apnea (adult) (pediatric): Secondary | ICD-10-CM | POA: Diagnosis not present

## 2020-02-19 DIAGNOSIS — Z79891 Long term (current) use of opiate analgesic: Secondary | ICD-10-CM | POA: Diagnosis not present

## 2020-02-19 DIAGNOSIS — M5416 Radiculopathy, lumbar region: Secondary | ICD-10-CM | POA: Diagnosis not present

## 2020-03-02 DIAGNOSIS — M654 Radial styloid tenosynovitis [de Quervain]: Secondary | ICD-10-CM | POA: Diagnosis not present

## 2020-03-02 DIAGNOSIS — G5601 Carpal tunnel syndrome, right upper limb: Secondary | ICD-10-CM | POA: Diagnosis not present

## 2020-03-02 DIAGNOSIS — M79642 Pain in left hand: Secondary | ICD-10-CM | POA: Diagnosis not present

## 2020-03-02 DIAGNOSIS — M79641 Pain in right hand: Secondary | ICD-10-CM | POA: Diagnosis not present

## 2020-04-01 DIAGNOSIS — G4733 Obstructive sleep apnea (adult) (pediatric): Secondary | ICD-10-CM | POA: Diagnosis not present

## 2020-04-01 DIAGNOSIS — M5416 Radiculopathy, lumbar region: Secondary | ICD-10-CM | POA: Diagnosis not present

## 2020-04-01 DIAGNOSIS — M62830 Muscle spasm of back: Secondary | ICD-10-CM | POA: Diagnosis not present

## 2020-04-01 DIAGNOSIS — G894 Chronic pain syndrome: Secondary | ICD-10-CM | POA: Diagnosis not present

## 2020-04-08 DIAGNOSIS — M654 Radial styloid tenosynovitis [de Quervain]: Secondary | ICD-10-CM | POA: Diagnosis not present

## 2020-05-08 DIAGNOSIS — G5601 Carpal tunnel syndrome, right upper limb: Secondary | ICD-10-CM | POA: Diagnosis not present

## 2020-05-08 DIAGNOSIS — M654 Radial styloid tenosynovitis [de Quervain]: Secondary | ICD-10-CM | POA: Diagnosis not present

## 2020-05-25 DIAGNOSIS — G894 Chronic pain syndrome: Secondary | ICD-10-CM | POA: Diagnosis not present

## 2020-05-25 DIAGNOSIS — M62830 Muscle spasm of back: Secondary | ICD-10-CM | POA: Diagnosis not present

## 2020-05-25 DIAGNOSIS — M5416 Radiculopathy, lumbar region: Secondary | ICD-10-CM | POA: Diagnosis not present

## 2020-05-25 DIAGNOSIS — G4733 Obstructive sleep apnea (adult) (pediatric): Secondary | ICD-10-CM | POA: Diagnosis not present

## 2020-06-12 ENCOUNTER — Other Ambulatory Visit: Payer: Self-pay

## 2020-06-12 ENCOUNTER — Encounter (HOSPITAL_BASED_OUTPATIENT_CLINIC_OR_DEPARTMENT_OTHER): Payer: Self-pay | Admitting: Orthopedic Surgery

## 2020-06-12 ENCOUNTER — Other Ambulatory Visit: Payer: Self-pay | Admitting: Orthopedic Surgery

## 2020-06-12 DIAGNOSIS — M654 Radial styloid tenosynovitis [de Quervain]: Secondary | ICD-10-CM | POA: Diagnosis not present

## 2020-06-15 ENCOUNTER — Other Ambulatory Visit (HOSPITAL_COMMUNITY)
Admission: RE | Admit: 2020-06-15 | Discharge: 2020-06-15 | Disposition: A | Discharge status: Home / Self Care | Payer: BC Managed Care – PPO | Source: Ambulatory Visit | Attending: Orthopedic Surgery | Admitting: Orthopedic Surgery

## 2020-06-15 DIAGNOSIS — Z01812 Encounter for preprocedural laboratory examination: Secondary | ICD-10-CM | POA: Insufficient documentation

## 2020-06-15 DIAGNOSIS — Z791 Long term (current) use of non-steroidal anti-inflammatories (NSAID): Secondary | ICD-10-CM | POA: Diagnosis not present

## 2020-06-15 DIAGNOSIS — Z803 Family history of malignant neoplasm of breast: Secondary | ICD-10-CM | POA: Diagnosis not present

## 2020-06-15 DIAGNOSIS — Z20822 Contact with and (suspected) exposure to covid-19: Secondary | ICD-10-CM | POA: Insufficient documentation

## 2020-06-15 DIAGNOSIS — M654 Radial styloid tenosynovitis [de Quervain]: Secondary | ICD-10-CM | POA: Diagnosis not present

## 2020-06-15 DIAGNOSIS — K219 Gastro-esophageal reflux disease without esophagitis: Secondary | ICD-10-CM | POA: Diagnosis not present

## 2020-06-15 DIAGNOSIS — Z79899 Other long term (current) drug therapy: Secondary | ICD-10-CM | POA: Diagnosis not present

## 2020-06-15 DIAGNOSIS — Z808 Family history of malignant neoplasm of other organs or systems: Secondary | ICD-10-CM | POA: Diagnosis not present

## 2020-06-15 DIAGNOSIS — Z801 Family history of malignant neoplasm of trachea, bronchus and lung: Secondary | ICD-10-CM | POA: Diagnosis not present

## 2020-06-15 DIAGNOSIS — Z8261 Family history of arthritis: Secondary | ICD-10-CM | POA: Diagnosis not present

## 2020-06-15 LAB — SARS CORONAVIRUS 2 (TAT 6-24 HRS): SARS Coronavirus 2: NEGATIVE

## 2020-06-18 ENCOUNTER — Encounter (HOSPITAL_BASED_OUTPATIENT_CLINIC_OR_DEPARTMENT_OTHER)
Admission: RE | Disposition: A | Discharge status: Home / Self Care | Payer: Self-pay | Source: Home / Self Care | Attending: Orthopedic Surgery

## 2020-06-18 ENCOUNTER — Encounter (HOSPITAL_BASED_OUTPATIENT_CLINIC_OR_DEPARTMENT_OTHER): Payer: Self-pay | Admitting: Orthopedic Surgery

## 2020-06-18 ENCOUNTER — Ambulatory Visit (HOSPITAL_BASED_OUTPATIENT_CLINIC_OR_DEPARTMENT_OTHER): Payer: BC Managed Care – PPO | Admitting: Anesthesiology

## 2020-06-18 ENCOUNTER — Ambulatory Visit (HOSPITAL_BASED_OUTPATIENT_CLINIC_OR_DEPARTMENT_OTHER)
Admission: RE | Admit: 2020-06-18 | Discharge: 2020-06-18 | Disposition: A | Discharge status: Home / Self Care | Payer: BC Managed Care – PPO | Attending: Orthopedic Surgery | Admitting: Orthopedic Surgery

## 2020-06-18 ENCOUNTER — Other Ambulatory Visit: Payer: Self-pay

## 2020-06-18 DIAGNOSIS — Z79899 Other long term (current) drug therapy: Secondary | ICD-10-CM | POA: Diagnosis not present

## 2020-06-18 DIAGNOSIS — Z8261 Family history of arthritis: Secondary | ICD-10-CM | POA: Diagnosis not present

## 2020-06-18 DIAGNOSIS — Z801 Family history of malignant neoplasm of trachea, bronchus and lung: Secondary | ICD-10-CM | POA: Insufficient documentation

## 2020-06-18 DIAGNOSIS — M5126 Other intervertebral disc displacement, lumbar region: Secondary | ICD-10-CM | POA: Diagnosis not present

## 2020-06-18 DIAGNOSIS — Z808 Family history of malignant neoplasm of other organs or systems: Secondary | ICD-10-CM | POA: Diagnosis not present

## 2020-06-18 DIAGNOSIS — Z803 Family history of malignant neoplasm of breast: Secondary | ICD-10-CM | POA: Insufficient documentation

## 2020-06-18 DIAGNOSIS — M654 Radial styloid tenosynovitis [de Quervain]: Secondary | ICD-10-CM | POA: Insufficient documentation

## 2020-06-18 DIAGNOSIS — Z791 Long term (current) use of non-steroidal anti-inflammatories (NSAID): Secondary | ICD-10-CM | POA: Diagnosis not present

## 2020-06-18 DIAGNOSIS — G4730 Sleep apnea, unspecified: Secondary | ICD-10-CM | POA: Diagnosis not present

## 2020-06-18 DIAGNOSIS — K219 Gastro-esophageal reflux disease without esophagitis: Secondary | ICD-10-CM | POA: Diagnosis not present

## 2020-06-18 DIAGNOSIS — Z20822 Contact with and (suspected) exposure to covid-19: Secondary | ICD-10-CM | POA: Insufficient documentation

## 2020-06-18 HISTORY — PX: DORSAL COMPARTMENT RELEASE: SHX5039

## 2020-06-18 HISTORY — DX: Sleep apnea, unspecified: G47.30

## 2020-06-18 HISTORY — DX: Radial styloid tenosynovitis (de quervain): M65.4

## 2020-06-18 HISTORY — DX: Gastro-esophageal reflux disease without esophagitis: K21.9

## 2020-06-18 SURGERY — RELEASE, FIRST DORSAL COMPARTMENT, HAND
Anesthesia: General | Site: Arm Lower | Laterality: Right

## 2020-06-18 MED ORDER — ONDANSETRON HCL 4 MG/2ML IJ SOLN
INTRAMUSCULAR | Status: DC | PRN
  Administered 2020-06-18: 4 mg via INTRAVENOUS

## 2020-06-18 MED ORDER — TRAMADOL HCL 50 MG PO TABS: 50.0000 mg | ORAL_TABLET | Freq: Four times a day (QID) | ORAL | 0 refills | Status: DC | PRN

## 2020-06-18 MED ORDER — PROPOFOL 10 MG/ML IV BOLUS
INTRAVENOUS | Status: AC
  Filled 2020-06-18: qty 20

## 2020-06-18 MED ORDER — CEFAZOLIN SODIUM-DEXTROSE 2-4 GM/100ML-% IV SOLN
INTRAVENOUS | Status: AC
  Filled 2020-06-18: qty 100

## 2020-06-18 MED ORDER — BUPIVACAINE HCL (PF) 0.25 % IJ SOLN
INTRAMUSCULAR | Status: DC | PRN
  Administered 2020-06-18: 6 mL

## 2020-06-18 MED ORDER — FENTANYL CITRATE (PF) 100 MCG/2ML IJ SOLN
INTRAMUSCULAR | Status: DC | PRN
  Administered 2020-06-18: 25 ug via INTRAVENOUS
  Administered 2020-06-18: 50 ug via INTRAVENOUS
  Administered 2020-06-18: 25 ug via INTRAVENOUS

## 2020-06-18 MED ORDER — ONDANSETRON HCL 4 MG/2ML IJ SOLN: 4.0000 mg | Freq: Four times a day (QID) | INTRAMUSCULAR | Status: DC | PRN

## 2020-06-18 MED ORDER — CEFAZOLIN SODIUM-DEXTROSE 2-4 GM/100ML-% IV SOLN
2.0000 g | INTRAVENOUS | Status: AC
  Administered 2020-06-18: 2 g via INTRAVENOUS

## 2020-06-18 MED ORDER — DEXAMETHASONE SODIUM PHOSPHATE 4 MG/ML IJ SOLN
INTRAMUSCULAR | Status: DC | PRN
  Administered 2020-06-18: 4 mg via INTRAVENOUS

## 2020-06-18 MED ORDER — LIDOCAINE 2% (20 MG/ML) 5 ML SYRINGE
INTRAMUSCULAR | Status: AC
  Filled 2020-06-18: qty 5

## 2020-06-18 MED ORDER — LACTATED RINGERS IV SOLN: INTRAVENOUS | Status: DC

## 2020-06-18 MED ORDER — PROPOFOL 10 MG/ML IV BOLUS
INTRAVENOUS | Status: DC | PRN
Start: 2020-06-18 — End: 2020-06-18
  Administered 2020-06-18: 200 mg via INTRAVENOUS

## 2020-06-18 MED ORDER — FENTANYL CITRATE (PF) 100 MCG/2ML IJ SOLN
INTRAMUSCULAR | Status: AC
  Filled 2020-06-18: qty 2

## 2020-06-18 MED ORDER — MIDAZOLAM HCL 2 MG/2ML IJ SOLN
INTRAMUSCULAR | Status: DC | PRN
  Administered 2020-06-18: 2 mg via INTRAVENOUS

## 2020-06-18 MED ORDER — MIDAZOLAM HCL 2 MG/2ML IJ SOLN
INTRAMUSCULAR | Status: AC
  Filled 2020-06-18: qty 2

## 2020-06-18 MED ORDER — OXYCODONE HCL 5 MG/5ML PO SOLN: 5.0000 mg | Freq: Once | ORAL | Status: DC | PRN

## 2020-06-18 MED ORDER — OXYCODONE HCL 5 MG PO TABS
5.0000 mg | ORAL_TABLET | Freq: Once | ORAL | Status: DC | PRN
Start: 2020-06-18 — End: 2020-06-18

## 2020-06-18 MED ORDER — LIDOCAINE HCL (CARDIAC) PF 100 MG/5ML IV SOSY
PREFILLED_SYRINGE | INTRAVENOUS | Status: DC | PRN
  Administered 2020-06-18: 60 mg via INTRAVENOUS

## 2020-06-18 MED ORDER — FENTANYL CITRATE (PF) 100 MCG/2ML IJ SOLN: 25.0000 ug | INTRAMUSCULAR | Status: DC | PRN

## 2020-06-18 SURGICAL SUPPLY — 43 items
APL PRP STRL LF DISP 70% ISPRP (MISCELLANEOUS) ×1
BLADE SURG 15 STRL LF DISP TIS (BLADE) ×1 IMPLANT
BLADE SURG 15 STRL SS (BLADE) ×2
BNDG CMPR 9X4 STRL LF SNTH (GAUZE/BANDAGES/DRESSINGS) ×1
BNDG COHESIVE 3X5 TAN STRL LF (GAUZE/BANDAGES/DRESSINGS) ×2 IMPLANT
BNDG ESMARK 4X9 LF (GAUZE/BANDAGES/DRESSINGS) ×2 IMPLANT
BNDG GAUZE ELAST 4 BULKY (GAUZE/BANDAGES/DRESSINGS) ×2 IMPLANT
CHLORAPREP W/TINT 26 (MISCELLANEOUS) ×2 IMPLANT
CORD BIPOLAR FORCEPS 12FT (ELECTRODE) ×2 IMPLANT
COVER BACK TABLE 60X90IN (DRAPES) ×2 IMPLANT
COVER MAYO STAND STRL (DRAPES) ×2 IMPLANT
COVER WAND RF STERILE (DRAPES) IMPLANT
CUFF TOURN SGL QUICK 18X4 (TOURNIQUET CUFF) ×2 IMPLANT
DECANTER SPIKE VIAL GLASS SM (MISCELLANEOUS) IMPLANT
DRAPE EXTREMITY T 121X128X90 (DISPOSABLE) ×2 IMPLANT
DRAPE SURG 17X23 STRL (DRAPES) ×2 IMPLANT
GAUZE SPONGE 4X4 12PLY STRL (GAUZE/BANDAGES/DRESSINGS) ×2 IMPLANT
GAUZE XEROFORM 1X8 LF (GAUZE/BANDAGES/DRESSINGS) ×2 IMPLANT
GLOVE SURG ORTHO LTX SZ8 (GLOVE) ×2 IMPLANT
GLOVE SURG POLYISO LF SZ8 (GLOVE) ×2 IMPLANT
GLOVE SURG UNDER POLY LF SZ7 (GLOVE) ×2 IMPLANT
GLOVE SURG UNDER POLY LF SZ8.5 (GLOVE) ×2 IMPLANT
GOWN STRL REUS W/ TWL LRG LVL3 (GOWN DISPOSABLE) ×1 IMPLANT
GOWN STRL REUS W/TWL 2XL LVL3 (GOWN DISPOSABLE) ×2 IMPLANT
GOWN STRL REUS W/TWL LRG LVL3 (GOWN DISPOSABLE) ×2
GOWN STRL REUS W/TWL XL LVL3 (GOWN DISPOSABLE) ×2 IMPLANT
NEEDLE PRECISIONGLIDE 27X1.5 (NEEDLE) ×2 IMPLANT
NS IRRIG 1000ML POUR BTL (IV SOLUTION) ×2 IMPLANT
PACK BASIN DAY SURGERY FS (CUSTOM PROCEDURE TRAY) ×2 IMPLANT
PAD CAST 3X4 CTTN HI CHSV (CAST SUPPLIES) ×1 IMPLANT
PADDING CAST ABS 4INX4YD NS (CAST SUPPLIES)
PADDING CAST ABS COTTON 4X4 ST (CAST SUPPLIES) IMPLANT
PADDING CAST COTTON 3X4 STRL (CAST SUPPLIES) ×2
SPLINT PLASTER CAST XFAST 3X15 (CAST SUPPLIES) ×10 IMPLANT
SPLINT PLASTER XTRA FASTSET 3X (CAST SUPPLIES) ×10
STOCKINETTE 4X48 STRL (DRAPES) ×2 IMPLANT
SUT ETHILON 4 0 PS 2 18 (SUTURE) ×2 IMPLANT
SUT VIC AB 4-0 P2 18 (SUTURE) IMPLANT
SUT VICRYL 4-0 PS2 18IN ABS (SUTURE) IMPLANT
SYR BULB EAR ULCER 3OZ GRN STR (SYRINGE) ×2 IMPLANT
SYR CONTROL 10ML LL (SYRINGE) ×2 IMPLANT
TOWEL GREEN STERILE FF (TOWEL DISPOSABLE) ×4 IMPLANT
UNDERPAD 30X36 HEAVY ABSORB (UNDERPADS AND DIAPERS) ×2 IMPLANT

## 2020-06-18 NOTE — Anesthesia Postprocedure Evaluation (Signed)
Anesthesia Post Note  Patient: Robert Deleon  Procedure(s) Performed: RELEASE RIGHT FIRST DORSAL COMPARTMENT (DEQUERVAIN) (Right Arm Lower)     Patient location during evaluation: PACU Anesthesia Type: General Level of consciousness: awake and alert Pain management: pain level controlled Vital Signs Assessment: post-procedure vital signs reviewed and stable Respiratory status: spontaneous breathing, nonlabored ventilation, respiratory function stable and patient connected to nasal cannula oxygen Cardiovascular status: blood pressure returned to baseline and stable Postop Assessment: no apparent nausea or vomiting Anesthetic complications: no   No complications documented.  Last Vitals:  Vitals:   06/18/20 1258 06/18/20 1309  BP: 122/78 (!) 144/83  Pulse: 75 70  Resp: 14 12  Temp:  36.7 C  SpO2: 98% 98%    Last Pain:  Vitals:   06/18/20 1309  TempSrc:   PainSc: 0-No pain                 Starletta Houchin S

## 2020-06-18 NOTE — Discharge Instructions (Addendum)

## 2020-06-18 NOTE — Anesthesia Procedure Notes (Signed)
Procedure Name: LMA Insertion Date/Time: 06/18/2020 12:08 PM Performed by: Maryella Shivers, CRNA Pre-anesthesia Checklist: Patient identified, Emergency Drugs available, Suction available and Patient being monitored Patient Re-evaluated:Patient Re-evaluated prior to induction Oxygen Delivery Method: Circle system utilized Preoxygenation: Pre-oxygenation with 100% oxygen Induction Type: IV induction Ventilation: Mask ventilation without difficulty LMA: LMA inserted LMA Size: 4.0 Number of attempts: 1 Airway Equipment and Method: Bite block Placement Confirmation: positive ETCO2 Tube secured with: Tape Dental Injury: Teeth and Oropharynx as per pre-operative assessment

## 2020-06-18 NOTE — Brief Op Note (Signed)
06/18/2020  12:39 PM  PATIENT:  Robert Deleon  60 y.o. male  PRE-OPERATIVE DIAGNOSIS:  RIGHT WRIST DEQUERVAINS  POST-OPERATIVE DIAGNOSIS:  RIGHT WRIST DEQUERVAINS  PROCEDURE:  Procedure(s): RELEASE RIGHT FIRST DORSAL COMPARTMENT (DEQUERVAIN) (Right)  SURGEON:  Surgeon(s) and Role:    * Daryll Brod, MD - Primary  PHYSICIAN ASSISTANT:   ASSISTANTS: none   ANESTHESIA:   local and general  EBL:  1 ml   BLOOD ADMINISTERED:none  DRAINS: none   LOCAL MEDICATIONS USED:  BUPIVICAINE   SPECIMEN:  No Specimen  DISPOSITION OF SPECIMEN:  N/A  COUNTS:  YES  TOURNIQUET:   Total Tourniquet Time Documented: Upper Arm (Right) - 14 minutes Total: Upper Arm (Right) - 14 minutes   DICTATION: .Viviann Spare Dictation  PLAN OF CARE: Discharge to home after PACU  PATIENT DISPOSITION:  PACU - hemodynamically stable.

## 2020-06-18 NOTE — H&P (Signed)
Robert Deleon is an 60 y.o. male.   Chief Complaint: right wrist pain HPI: Robert Deleon is a 60 year old male referred by [facility] for consultation regarding pain in his right hand. This been going on for approximately a year to year and a half on his right side he has less symptoms on his left. He complains of pain on the radial aspect of his hand going up his arm occasionally on the ulnar aspect. He has had a carpal tunnel injection on 01/03/2020 a first dorsal extensor compartment injection on his right side on 02/19/2020. He is awake and 7 out of 7 nights has no history of injury to the hand or to the neck. He has had nerve conductions done revealing mild carpal tunnel syndrome on his right side. He is in pain management for lumbar disc and pain from that he is taking 1 Celebrex a day he has a history of arthritis no history of diabetes thyroid problems or gout. Family history is positive arthritis negative for the remainder.This been injected on 2 occasions with resolution for short period of time. He also complains of occasional discomfort on the ulnar aspect of his wrist. He is not complaining of any numbness or tingling.      Past Medical History:  Diagnosis Date  . Back pain   . GERD (gastroesophageal reflux disease)   . Sleep apnea    does not wear CPAP  . Tenosynovitis, de Quervain    right    Past Surgical History:  Procedure Laterality Date  . BACK SURGERY     x5  . HERNIA REPAIR    . LUMBAR LAMINECTOMY Bilateral 05/11/2013   Procedure: MICRODISCECTOMY LUMBAR LAMINECTOMY  L 4-5;  Surgeon: Jessy Oto, MD;  Location: [facility];  Service: Orthopedics;  Laterality: Bilateral;    Family History  Problem Relation Age of Onset  . Allergies Mother        food allergies  . Breast cancer Mother   . Skin cancer Father   . Lung cancer Father    Social History:  reports that he has never smoked. He has never used smokeless tobacco. He reports current alcohol use. He reports that he  does not use drugs.  Allergies: No Known Allergies  Medications Prior to Admission  Medication Sig Dispense Refill  . celecoxib (CELEBREX) 200 MG capsule Take 1 capsule by mouth 2 (two) times daily.  2  . methocarbamol (ROBAXIN) 750 MG tablet Take 750 mg by mouth every 6 (six) hours.    Marland Kitchen morphine (MS CONTIN) 30 MG 12 hr tablet Take 1 tablet by mouth 2 (two) times daily.  0  . oxyCODONE-acetaminophen (PERCOCET) 10-325 MG tablet Take 1 tablet by mouth every 4 (four) hours as needed for pain.    . ranitidine (ZANTAC) 150 MG tablet Take 150 mg by mouth daily.     Marland Kitchen venlafaxine (EFFEXOR) 37.5 MG tablet Take 1 tablet by mouth 2 (two) times daily.  2    No results found for this or any previous visit (from the past 48 hour(s)).  No results found.   Pertinent items are noted in HPI.  Height 5\' 10"  (1.778 m), weight 90.7 kg.  General appearance: alert, cooperative and appears stated age Head: Normocephalic, without obvious abnormality Neck: no JVD Resp: clear to auscultation bilaterally Cardio: regular rate and rhythm, S1, S2 normal, no murmur, click, rub or gallop GI: soft, non-tender; bowel sounds normal; no masses,  no organomegaly Extremities: right wrist vpain Pulses: 2+  and symmetric Skin: Skin color, texture, turgor normal. No rashes or lesions Neurologic: Grossly normal Incision/Wound: na  Assessment/Plan Assessment:  1. De Quervain's syndrome (tenosynovitis)    Plan: Would recommend release of the first dorsal extensor compartment. We have discussed the fact that he does have positive nerve conduction but he does not have any numbness or tingling in does not appear to have it pain from the carpal tunnel syndrome and as such we will not recommend releasing the carpal canal which he agrees with. He is aware there is no guarantee of surgery the possibility of infection recurrence injury to arteries nerves tendons incomplete relief symptoms dystrophy possibility of irritation of  the radial nerve. This scheduled for release first dorsal extensor compartment right wrist as an outpatient under regional anesthesia     Robert Deleon 06/18/2020, 9:53 AM

## 2020-06-18 NOTE — Anesthesia Preprocedure Evaluation (Signed)
Anesthesia Evaluation  Patient identified by MRN, date of birth, ID band Patient awake    Reviewed: Allergy & Precautions, H&P , NPO status , Patient's Chart, lab work & pertinent test results  Airway Mallampati: II   Neck ROM: full    Dental   Pulmonary sleep apnea ,    breath sounds clear to auscultation       Cardiovascular negative cardio ROS   Rhythm:regular Rate:Normal     Neuro/Psych  Neuromuscular disease    GI/Hepatic GERD  ,  Endo/Other    Renal/GU      Musculoskeletal   Abdominal   Peds  Hematology   Anesthesia Other Findings   Reproductive/Obstetrics                             Anesthesia Physical Anesthesia Plan  ASA: II  Anesthesia Plan: General   Post-op Pain Management:    Induction: Intravenous  PONV Risk Score and Plan: 2 and Ondansetron, Dexamethasone, Midazolam and Treatment may vary due to age or medical condition  Airway Management Planned: LMA  Additional Equipment:   Intra-op Plan:   Post-operative Plan: Extubation in OR  Informed Consent: I have reviewed the patients History and Physical, chart, labs and discussed the procedure including the risks, benefits and alternatives for the proposed anesthesia with the patient or authorized representative who has indicated his/her understanding and acceptance.     Dental advisory given  Plan Discussed with: CRNA, Anesthesiologist and Surgeon  Anesthesia Plan Comments:         Anesthesia Quick Evaluation

## 2020-06-18 NOTE — Op Note (Signed)
NAME: Robert Deleon MEDICAL RECORD NO: [id number] DATE OF BIRTH: 1960/09/25 FACILITY: [facility] LOCATION: [facility] PHYSICIAN: Wynonia Sours, MD   OPERATIVE REPORT   DATE OF PROCEDURE: 06/18/20    PREOPERATIVE DIAGNOSIS:   Tennis Must Quervain's tendinitis right wrist   POSTOPERATIVE DIAGNOSIS:   Same   PROCEDURE:   Decompression first dorsal extensor compartment right wrist   SURGEON: Daryll Brod, M.D.   ASSISTANT: none   ANESTHESIA:  General and Local   INTRAVENOUS FLUIDS:  Per anesthesia flow sheet.   ESTIMATED BLOOD LOSS:  Minimal.   COMPLICATIONS:  None.   SPECIMENS:  none   TOURNIQUET TIME:    Total Tourniquet Time Documented: Upper Arm (Right) - 14 minutes Total: Upper Arm (Right) - 14 minutes    DISPOSITION:  Stable to PACU.   INDICATIONS: Patient is a 60 year old male with history of radial sided wrist pain which has not responded to conservative treatment including injections and splinting anti-inflammatories.  He is elected undergo surgical release of the first dorsal extensor compartment right wrist.  Prepare postoperative course been discussed along with risk complications.  He is aware there is no guarantee to the surgery possibility of infection recurrence injury to arteries nerves tendons complete relief symptoms just possibility of radial nerve problems or irritation from the surgery.  In the preoperative area the patient seen the extremity marked by both patient and surgeon antibiotic given  OPERATIVE COURSE: Patient is brought to the operating room placed in the supine position with the right arm free of general anesthetic was carried out without difficulty under the direction the anesthesia department.  Prep was done with ChloraPrep and a 3-minute dry time allowed to a timeout was taken to confirm patient procedure.  The limb was exsanguinated with an Esmarch bandage turn placed on the arm was inflated to 250 mmHg.  A longitudinal incision was  made over the first dorsal extensor compartment tendons at the radial styloid right wrist carried down through subcutaneous tissue.  Bleeders were electrocauterized with bipolar.  Radial nerve branches were identified protected with retractors.  The first dorsal extensor compartment retinaculum was then released on its dorsal border.  The BB was immediately encountered.  A separate compartment was present for the abductor pollicis longus.  This was released.  Partial tenosynovectomy performed proximally between the 2 muscle bellies.  The tendons were confirmed with motion of the thumb.  Was copious irrigated with saline.  The retinaculum was then placed over the 2 tendons from the dorsal incision.  The wound closed with interrupted 4-0 nylon sutures.  Local infiltration quarter percent bupivacaine without epinephrine was given approximately 7 cc was used.  Sterile compressive dressing and thumb spica splint applied.  Deflation of the tourniquet all fingers immediately pink.  Was taken to the recovery room for observation in satisfactory condition.  He will be discharged home to return to the hand center regimen 1 week Tylenol ibuprofen for pain with Ultram for breakthrough.   Daryll Brod, MD Electronically signed, 06/18/20

## 2020-06-18 NOTE — Transfer of Care (Signed)
Immediate Anesthesia Transfer of Care Note  Patient: Robert Deleon  Procedure(s) Performed: RELEASE RIGHT FIRST DORSAL COMPARTMENT (DEQUERVAIN) (Right Arm Lower)  Patient Location: PACU  Anesthesia Type:General  Level of Consciousness: sedated  Airway & Oxygen Therapy: Patient Spontanous Breathing and Patient connected to face mask oxygen  Post-op Assessment: Report given to RN and Post -op Vital signs reviewed and stable  Post vital signs: Reviewed and stable  Last Vitals:  Vitals Value Taken Time  BP 106/73 06/18/20 1239  Temp    Pulse 61 06/18/20 1243  Resp 13 06/18/20 1243  SpO2 97 % 06/18/20 1243  Vitals shown include unvalidated device data.  Last Pain:  Vitals:   06/18/20 0953  TempSrc: Oral  PainSc: 4       Patients Stated Pain Goal: 5 (86/57/84 6962)  Complications: No complications documented.

## 2020-06-19 ENCOUNTER — Encounter (HOSPITAL_BASED_OUTPATIENT_CLINIC_OR_DEPARTMENT_OTHER): Payer: Self-pay | Admitting: Orthopedic Surgery

## 2020-07-16 DIAGNOSIS — H16223 Keratoconjunctivitis sicca, not specified as Sjogren's, bilateral: Secondary | ICD-10-CM | POA: Diagnosis not present

## 2020-07-20 DIAGNOSIS — M5416 Radiculopathy, lumbar region: Secondary | ICD-10-CM | POA: Diagnosis not present

## 2020-07-20 DIAGNOSIS — G894 Chronic pain syndrome: Secondary | ICD-10-CM | POA: Diagnosis not present

## 2020-07-20 DIAGNOSIS — G4733 Obstructive sleep apnea (adult) (pediatric): Secondary | ICD-10-CM | POA: Diagnosis not present

## 2020-07-20 DIAGNOSIS — Z79891 Long term (current) use of opiate analgesic: Secondary | ICD-10-CM | POA: Diagnosis not present

## 2020-07-20 DIAGNOSIS — M62830 Muscle spasm of back: Secondary | ICD-10-CM | POA: Diagnosis not present

## 2020-08-02 DIAGNOSIS — Z20822 Contact with and (suspected) exposure to covid-19: Secondary | ICD-10-CM | POA: Diagnosis not present

## 2020-08-02 DIAGNOSIS — U071 COVID-19: Secondary | ICD-10-CM | POA: Diagnosis not present

## 2020-08-28 DIAGNOSIS — M778 Other enthesopathies, not elsewhere classified: Secondary | ICD-10-CM | POA: Diagnosis not present

## 2020-08-28 DIAGNOSIS — M654 Radial styloid tenosynovitis [de Quervain]: Secondary | ICD-10-CM | POA: Diagnosis not present

## 2020-09-14 DIAGNOSIS — M5416 Radiculopathy, lumbar region: Secondary | ICD-10-CM | POA: Diagnosis not present

## 2020-09-14 DIAGNOSIS — M62830 Muscle spasm of back: Secondary | ICD-10-CM | POA: Diagnosis not present

## 2020-09-14 DIAGNOSIS — G4733 Obstructive sleep apnea (adult) (pediatric): Secondary | ICD-10-CM | POA: Diagnosis not present

## 2020-09-14 DIAGNOSIS — G894 Chronic pain syndrome: Secondary | ICD-10-CM | POA: Diagnosis not present

## 2020-10-02 DIAGNOSIS — M778 Other enthesopathies, not elsewhere classified: Secondary | ICD-10-CM | POA: Diagnosis not present

## 2020-10-14 DIAGNOSIS — M25431 Effusion, right wrist: Secondary | ICD-10-CM | POA: Diagnosis not present

## 2020-10-14 DIAGNOSIS — M659 Synovitis and tenosynovitis, unspecified: Secondary | ICD-10-CM | POA: Diagnosis not present

## 2020-10-19 DIAGNOSIS — M25531 Pain in right wrist: Secondary | ICD-10-CM | POA: Diagnosis not present

## 2020-11-16 DIAGNOSIS — M5416 Radiculopathy, lumbar region: Secondary | ICD-10-CM | POA: Diagnosis not present

## 2020-11-16 DIAGNOSIS — M62830 Muscle spasm of back: Secondary | ICD-10-CM | POA: Diagnosis not present

## 2020-11-16 DIAGNOSIS — G894 Chronic pain syndrome: Secondary | ICD-10-CM | POA: Diagnosis not present

## 2020-11-16 DIAGNOSIS — G4733 Obstructive sleep apnea (adult) (pediatric): Secondary | ICD-10-CM | POA: Diagnosis not present

## 2020-11-17 DIAGNOSIS — Z111 Encounter for screening for respiratory tuberculosis: Secondary | ICD-10-CM | POA: Diagnosis not present

## 2020-11-17 DIAGNOSIS — R5383 Other fatigue: Secondary | ICD-10-CM | POA: Diagnosis not present

## 2020-11-17 DIAGNOSIS — M130 Polyarthritis, unspecified: Secondary | ICD-10-CM | POA: Diagnosis not present

## 2020-12-07 DIAGNOSIS — M150 Primary generalized (osteo)arthritis: Secondary | ICD-10-CM | POA: Diagnosis not present

## 2020-12-07 DIAGNOSIS — M0579 Rheumatoid arthritis with rheumatoid factor of multiple sites without organ or systems involvement: Secondary | ICD-10-CM | POA: Diagnosis not present

## 2020-12-07 DIAGNOSIS — M5136 Other intervertebral disc degeneration, lumbar region: Secondary | ICD-10-CM | POA: Diagnosis not present

## 2020-12-28 DIAGNOSIS — M79641 Pain in right hand: Secondary | ICD-10-CM | POA: Diagnosis not present

## 2020-12-28 DIAGNOSIS — M069 Rheumatoid arthritis, unspecified: Secondary | ICD-10-CM | POA: Diagnosis not present

## 2021-01-11 DIAGNOSIS — M62830 Muscle spasm of back: Secondary | ICD-10-CM | POA: Diagnosis not present

## 2021-01-11 DIAGNOSIS — G894 Chronic pain syndrome: Secondary | ICD-10-CM | POA: Diagnosis not present

## 2021-01-11 DIAGNOSIS — M5416 Radiculopathy, lumbar region: Secondary | ICD-10-CM | POA: Diagnosis not present

## 2021-01-11 DIAGNOSIS — Z79891 Long term (current) use of opiate analgesic: Secondary | ICD-10-CM | POA: Diagnosis not present

## 2021-01-11 DIAGNOSIS — G4733 Obstructive sleep apnea (adult) (pediatric): Secondary | ICD-10-CM | POA: Diagnosis not present

## 2021-02-01 DIAGNOSIS — M5136 Other intervertebral disc degeneration, lumbar region: Secondary | ICD-10-CM | POA: Diagnosis not present

## 2021-02-01 DIAGNOSIS — M150 Primary generalized (osteo)arthritis: Secondary | ICD-10-CM | POA: Diagnosis not present

## 2021-02-01 DIAGNOSIS — M0579 Rheumatoid arthritis with rheumatoid factor of multiple sites without organ or systems involvement: Secondary | ICD-10-CM | POA: Diagnosis not present

## 2021-03-08 DIAGNOSIS — G4733 Obstructive sleep apnea (adult) (pediatric): Secondary | ICD-10-CM | POA: Diagnosis not present

## 2021-03-08 DIAGNOSIS — M5416 Radiculopathy, lumbar region: Secondary | ICD-10-CM | POA: Diagnosis not present

## 2021-03-08 DIAGNOSIS — M62830 Muscle spasm of back: Secondary | ICD-10-CM | POA: Diagnosis not present

## 2021-03-08 DIAGNOSIS — G894 Chronic pain syndrome: Secondary | ICD-10-CM | POA: Diagnosis not present

## 2021-04-25 IMAGING — CR DG WRIST COMPLETE 3+V*R*
4 series · 4 of 4 positions shown · non-contrast
Comparison: None.

CLINICAL DATA: Right wrist pain. Swelling for 6 months. No known
injury.

EXAM:
RIGHT WRIST - COMPLETE 3+ VIEW

[x wrist pa right]
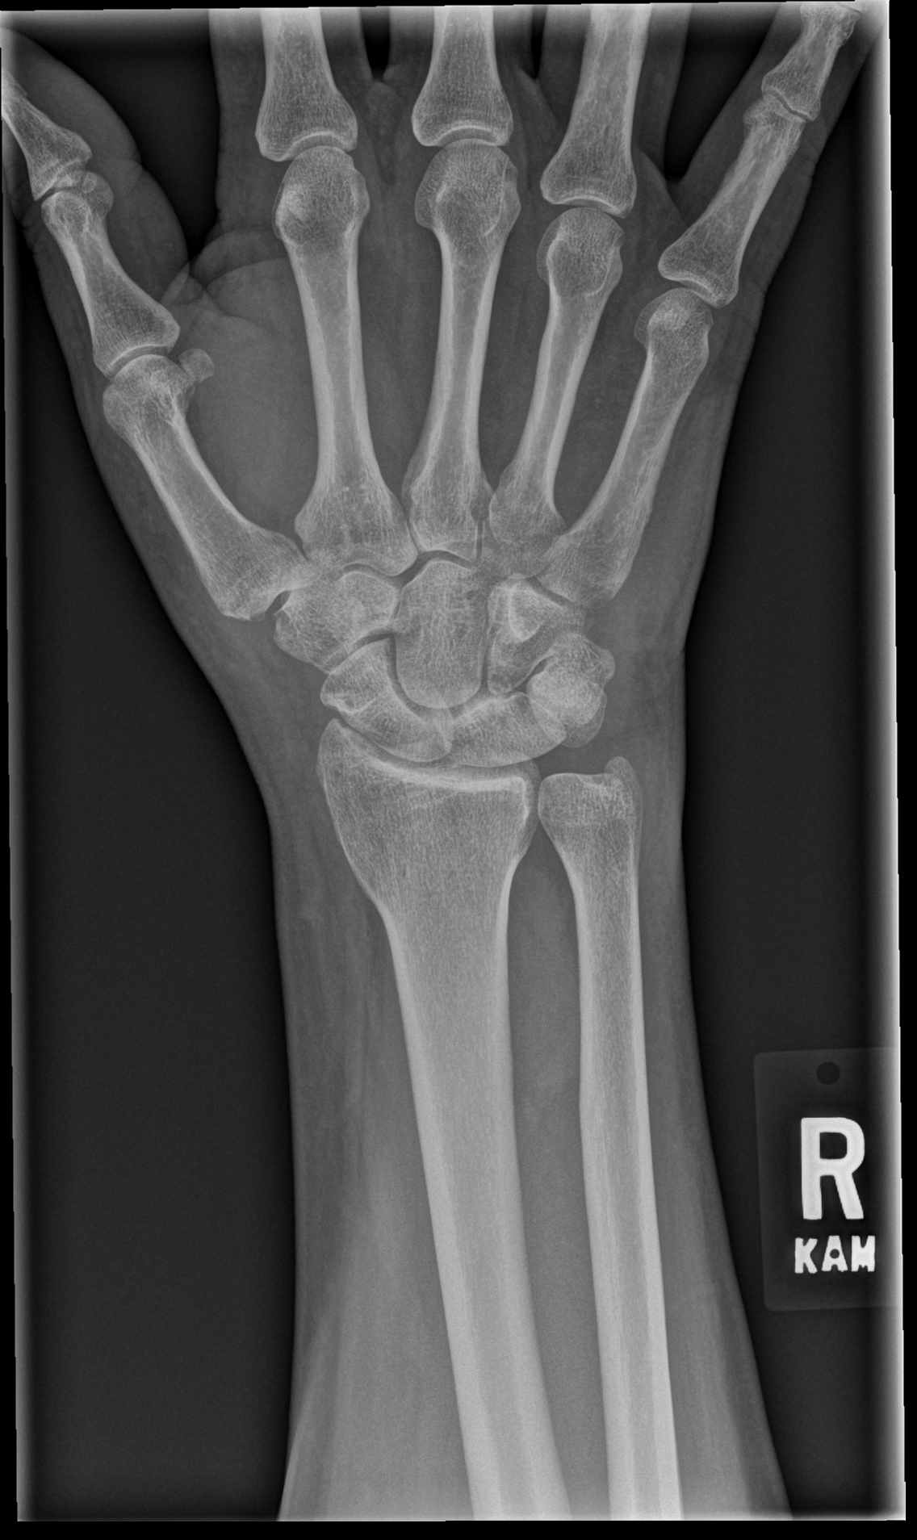

[x wrist obl right]
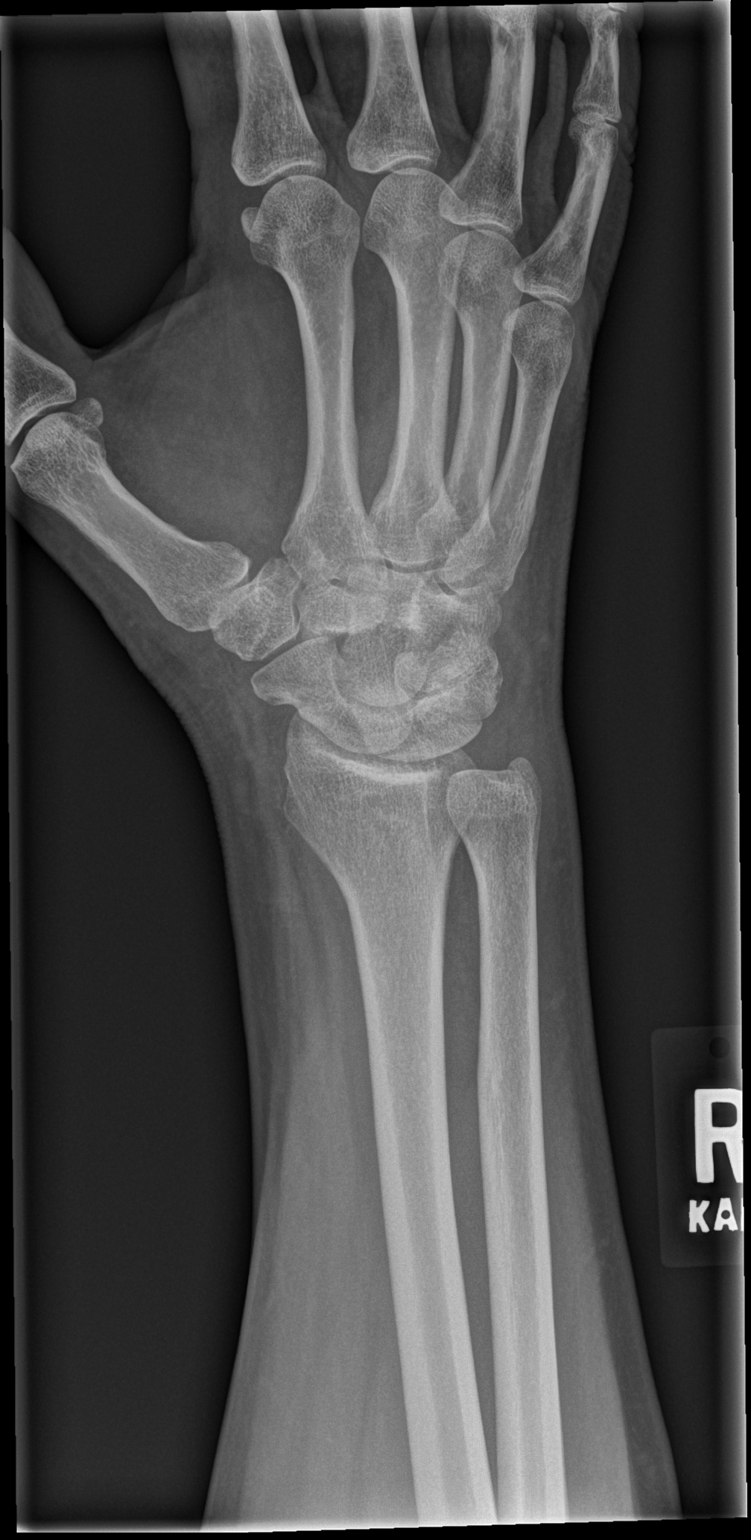

[x wrist lat right (1 of 2)]
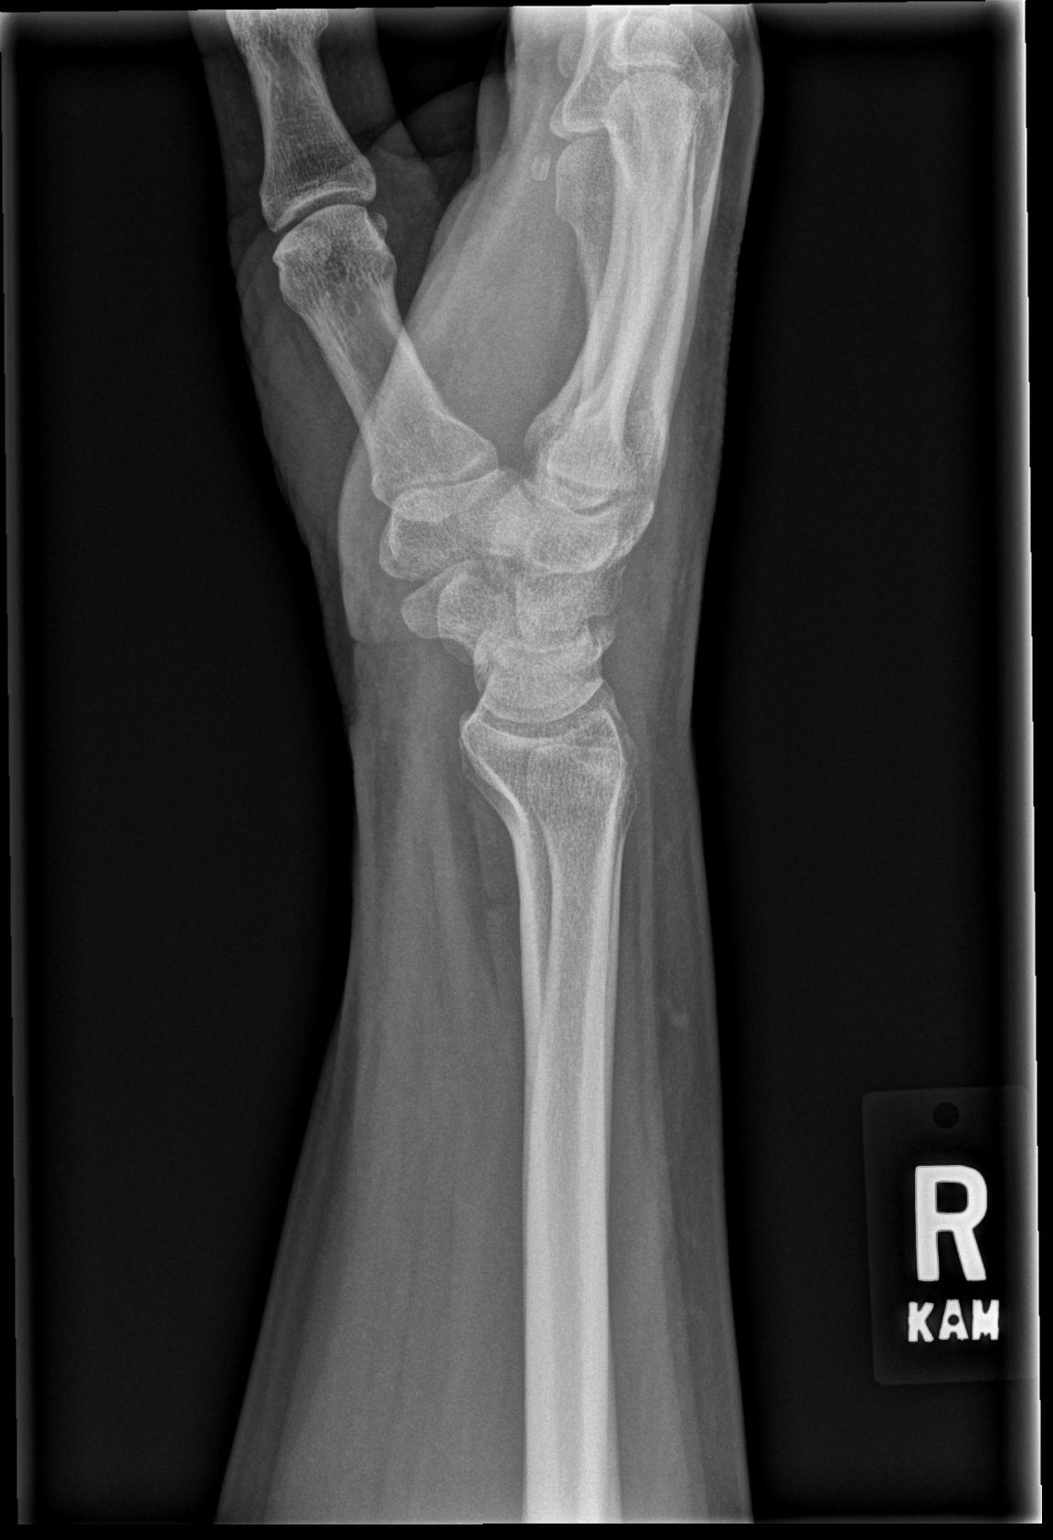

[x wrist lat right (2 of 2)]
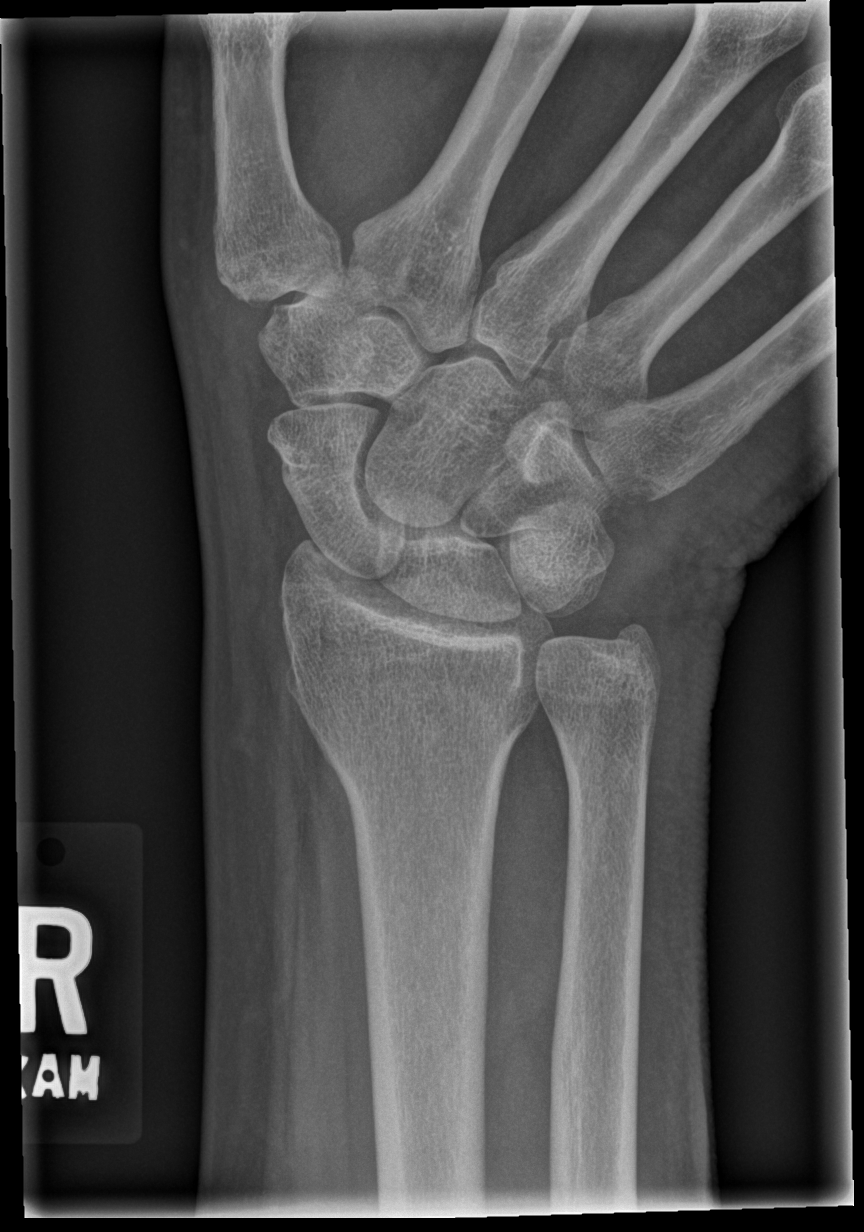

[4 of 4 positions shown; findings below may reference images not displayed]

FINDINGS: Normal alignment. There is mild radiocarpal joint space narrowing
with spurring of the radial styloid. Occasional cystic changes in
the carpal bones, typically degenerative. No erosion, bony
destruction, or periosteal reaction. Question of generalized soft
tissue edema. No soft tissue calcifications.
IMPRESSION: 1. Mild osteoarthritis of the radiocarpal joint. Occasional cystic
changes in the carpal bones, typically degenerative.
2. No evidence of inflammatory arthropathy.
3. Question of generalized soft tissue edema.

## 2021-05-04 DIAGNOSIS — G894 Chronic pain syndrome: Secondary | ICD-10-CM | POA: Diagnosis not present

## 2021-05-04 DIAGNOSIS — M62830 Muscle spasm of back: Secondary | ICD-10-CM | POA: Diagnosis not present

## 2021-05-04 DIAGNOSIS — M5416 Radiculopathy, lumbar region: Secondary | ICD-10-CM | POA: Diagnosis not present

## 2021-05-04 DIAGNOSIS — G4733 Obstructive sleep apnea (adult) (pediatric): Secondary | ICD-10-CM | POA: Diagnosis not present

## 2021-05-17 DIAGNOSIS — M5136 Other intervertebral disc degeneration, lumbar region: Secondary | ICD-10-CM | POA: Diagnosis not present

## 2021-05-17 DIAGNOSIS — M0579 Rheumatoid arthritis with rheumatoid factor of multiple sites without organ or systems involvement: Secondary | ICD-10-CM | POA: Diagnosis not present

## 2021-05-17 DIAGNOSIS — M150 Primary generalized (osteo)arthritis: Secondary | ICD-10-CM | POA: Diagnosis not present

## 2021-06-28 DIAGNOSIS — G894 Chronic pain syndrome: Secondary | ICD-10-CM | POA: Diagnosis not present

## 2021-06-28 DIAGNOSIS — G4733 Obstructive sleep apnea (adult) (pediatric): Secondary | ICD-10-CM | POA: Diagnosis not present

## 2021-06-28 DIAGNOSIS — M62830 Muscle spasm of back: Secondary | ICD-10-CM | POA: Diagnosis not present

## 2021-06-28 DIAGNOSIS — M5416 Radiculopathy, lumbar region: Secondary | ICD-10-CM | POA: Diagnosis not present

## 2021-08-24 DIAGNOSIS — M5136 Other intervertebral disc degeneration, lumbar region: Secondary | ICD-10-CM | POA: Diagnosis not present

## 2021-08-24 DIAGNOSIS — M0579 Rheumatoid arthritis with rheumatoid factor of multiple sites without organ or systems involvement: Secondary | ICD-10-CM | POA: Diagnosis not present

## 2021-08-24 DIAGNOSIS — M1991 Primary osteoarthritis, unspecified site: Secondary | ICD-10-CM | POA: Diagnosis not present

## 2021-09-30 DIAGNOSIS — Z79891 Long term (current) use of opiate analgesic: Secondary | ICD-10-CM | POA: Diagnosis not present

## 2021-09-30 DIAGNOSIS — G4733 Obstructive sleep apnea (adult) (pediatric): Secondary | ICD-10-CM | POA: Diagnosis not present

## 2021-09-30 DIAGNOSIS — G894 Chronic pain syndrome: Secondary | ICD-10-CM | POA: Diagnosis not present

## 2021-09-30 DIAGNOSIS — M5416 Radiculopathy, lumbar region: Secondary | ICD-10-CM | POA: Diagnosis not present

## 2021-09-30 DIAGNOSIS — M62830 Muscle spasm of back: Secondary | ICD-10-CM | POA: Diagnosis not present

## 2021-11-25 DIAGNOSIS — M5416 Radiculopathy, lumbar region: Secondary | ICD-10-CM | POA: Diagnosis not present

## 2021-11-25 DIAGNOSIS — G4733 Obstructive sleep apnea (adult) (pediatric): Secondary | ICD-10-CM | POA: Diagnosis not present

## 2021-11-25 DIAGNOSIS — M62830 Muscle spasm of back: Secondary | ICD-10-CM | POA: Diagnosis not present

## 2021-11-25 DIAGNOSIS — G894 Chronic pain syndrome: Secondary | ICD-10-CM | POA: Diagnosis not present

## 2021-12-08 DIAGNOSIS — M1991 Primary osteoarthritis, unspecified site: Secondary | ICD-10-CM | POA: Diagnosis not present

## 2021-12-08 DIAGNOSIS — M0579 Rheumatoid arthritis with rheumatoid factor of multiple sites without organ or systems involvement: Secondary | ICD-10-CM | POA: Diagnosis not present

## 2021-12-08 DIAGNOSIS — M5136 Other intervertebral disc degeneration, lumbar region: Secondary | ICD-10-CM | POA: Diagnosis not present

## 2022-01-02 DIAGNOSIS — J039 Acute tonsillitis, unspecified: Secondary | ICD-10-CM | POA: Diagnosis not present

## 2022-01-02 DIAGNOSIS — J0110 Acute frontal sinusitis, unspecified: Secondary | ICD-10-CM | POA: Diagnosis not present

## 2022-01-02 DIAGNOSIS — J40 Bronchitis, not specified as acute or chronic: Secondary | ICD-10-CM | POA: Diagnosis not present

## 2022-01-20 DIAGNOSIS — Z79891 Long term (current) use of opiate analgesic: Secondary | ICD-10-CM | POA: Diagnosis not present

## 2022-01-20 DIAGNOSIS — M5416 Radiculopathy, lumbar region: Secondary | ICD-10-CM | POA: Diagnosis not present

## 2022-01-20 DIAGNOSIS — G4733 Obstructive sleep apnea (adult) (pediatric): Secondary | ICD-10-CM | POA: Diagnosis not present

## 2022-01-20 DIAGNOSIS — G894 Chronic pain syndrome: Secondary | ICD-10-CM | POA: Diagnosis not present

## 2022-01-20 DIAGNOSIS — M62830 Muscle spasm of back: Secondary | ICD-10-CM | POA: Diagnosis not present

## 2022-01-22 DIAGNOSIS — K4090 Unilateral inguinal hernia, without obstruction or gangrene, not specified as recurrent: Secondary | ICD-10-CM | POA: Diagnosis not present

## 2022-01-25 ENCOUNTER — Other Ambulatory Visit: Payer: Self-pay | Admitting: Family Medicine

## 2022-01-25 DIAGNOSIS — K4090 Unilateral inguinal hernia, without obstruction or gangrene, not specified as recurrent: Secondary | ICD-10-CM

## 2022-01-26 ENCOUNTER — Other Ambulatory Visit: Payer: BC Managed Care – PPO

## 2022-01-28 ENCOUNTER — Ambulatory Visit
Admission: RE | Admit: 2022-01-28 | Discharge: 2022-01-28 | Disposition: A | Discharge status: Home / Self Care | Payer: BC Managed Care – PPO | Source: Ambulatory Visit | Attending: Family Medicine | Admitting: Family Medicine

## 2022-01-28 DIAGNOSIS — K4090 Unilateral inguinal hernia, without obstruction or gangrene, not specified as recurrent: Secondary | ICD-10-CM | POA: Diagnosis not present

## 2022-02-01 ENCOUNTER — Ambulatory Visit: Payer: Self-pay | Admitting: General Surgery

## 2022-02-01 DIAGNOSIS — K4021 Bilateral inguinal hernia, without obstruction or gangrene, recurrent: Secondary | ICD-10-CM | POA: Diagnosis not present

## 2022-03-17 DIAGNOSIS — G4733 Obstructive sleep apnea (adult) (pediatric): Secondary | ICD-10-CM | POA: Diagnosis not present

## 2022-03-17 DIAGNOSIS — M5416 Radiculopathy, lumbar region: Secondary | ICD-10-CM | POA: Diagnosis not present

## 2022-03-17 DIAGNOSIS — G894 Chronic pain syndrome: Secondary | ICD-10-CM | POA: Diagnosis not present

## 2022-03-17 DIAGNOSIS — M62830 Muscle spasm of back: Secondary | ICD-10-CM | POA: Diagnosis not present

## 2022-03-18 ENCOUNTER — Encounter (HOSPITAL_BASED_OUTPATIENT_CLINIC_OR_DEPARTMENT_OTHER): Payer: Self-pay | Admitting: General Surgery

## 2022-03-18 ENCOUNTER — Other Ambulatory Visit: Payer: Self-pay

## 2022-03-24 NOTE — Anesthesia Preprocedure Evaluation (Addendum)
Anesthesia Evaluation  Patient identified by MRN, date of birth, ID band Patient awake    Reviewed: Allergy & Precautions, H&P , NPO status , Patient's Chart, lab work & pertinent test results  Airway Mallampati: II  TM Distance: >3 FB Neck ROM: Full    Dental no notable dental hx. (+) Dental Advisory Given, Teeth Intact   Pulmonary sleep apnea    Pulmonary exam normal breath sounds clear to auscultation       Cardiovascular negative cardio ROS Normal cardiovascular exam Rhythm:Regular Rate:Normal     Neuro/Psych  Neuromuscular disease    GI/Hepatic ,GERD  Medicated,,  Endo/Other    Renal/GU      Musculoskeletal   Abdominal   Peds  Hematology   Anesthesia Other Findings   Reproductive/Obstetrics                             Anesthesia Physical Anesthesia Plan  ASA: 3  Anesthesia Plan: General   Post-op Pain Management: Tylenol PO (pre-op)*, Celebrex PO (pre-op)*, Gabapentin PO (pre-op)* and Regional block*   Induction: Intravenous  PONV Risk Score and Plan: 4 or greater and Ondansetron, Dexamethasone, Midazolam, Treatment may vary due to age or medical condition and Propofol infusion  Airway Management Planned: LMA  Additional Equipment:   Intra-op Plan:   Post-operative Plan: Extubation in OR  Informed Consent: I have reviewed the patients History and Physical, chart, labs and discussed the procedure including the risks, benefits and alternatives for the proposed anesthesia with the patient or authorized representative who has indicated his/her understanding and acceptance.     Dental advisory given  Plan Discussed with: CRNA  Anesthesia Plan Comments:         Anesthesia Quick Evaluation

## 2022-03-24 NOTE — Progress Notes (Signed)
Sent text reminding pt to come in for pre surgery drink and CHG soap.

## 2022-03-24 NOTE — Progress Notes (Signed)
Gave patient CHG soap and instructions, patient verbalized understanding.       Patient Instructions  The night before surgery:  No food after midnight. ONLY clear liquids after midnight  The day of surgery (if you do NOT have diabetes):  Drink ONE (1) Pre-Surgery Clear Ensure as directed.   This drink was given to you during your hospital  pre-op appointment visit. The pre-op nurse will instruct you on the time to drink the  Pre-Surgery Ensure depending on your surgery time. Finish the drink at the designated time by the pre-op nurse.  Nothing else to drink after completing the  Pre-Surgery Clear Ensure.  The day of surgery (if you have diabetes): Drink ONE (1) Gatorade 2 (G2) as directed. This drink was given to you during your hospital  pre-op appointment visit.  The pre-op nurse will instruct you on the time to drink the   Gatorade 2 (G2) depending on your surgery time. Color of the Gatorade may vary. Red is not allowed. Nothing else to drink after completing the  Gatorade 2 (G2).         If you have questions, please contact your surgeon's office.

## 2022-03-25 ENCOUNTER — Encounter (HOSPITAL_BASED_OUTPATIENT_CLINIC_OR_DEPARTMENT_OTHER)
Admission: RE | Disposition: A | Discharge status: Home / Self Care | Payer: Self-pay | Source: Home / Self Care | Attending: General Surgery

## 2022-03-25 ENCOUNTER — Ambulatory Visit (HOSPITAL_BASED_OUTPATIENT_CLINIC_OR_DEPARTMENT_OTHER): Payer: BC Managed Care – PPO | Admitting: Anesthesiology

## 2022-03-25 ENCOUNTER — Other Ambulatory Visit: Payer: Self-pay

## 2022-03-25 ENCOUNTER — Encounter (HOSPITAL_BASED_OUTPATIENT_CLINIC_OR_DEPARTMENT_OTHER): Payer: Self-pay | Admitting: General Surgery

## 2022-03-25 ENCOUNTER — Ambulatory Visit (HOSPITAL_BASED_OUTPATIENT_CLINIC_OR_DEPARTMENT_OTHER)
Admission: RE | Admit: 2022-03-25 | Discharge: 2022-03-25 | Disposition: A | Discharge status: Home / Self Care | Payer: BC Managed Care – PPO | Attending: General Surgery | Admitting: General Surgery

## 2022-03-25 DIAGNOSIS — G8918 Other acute postprocedural pain: Secondary | ICD-10-CM | POA: Diagnosis not present

## 2022-03-25 DIAGNOSIS — G4730 Sleep apnea, unspecified: Secondary | ICD-10-CM | POA: Insufficient documentation

## 2022-03-25 DIAGNOSIS — D176 Benign lipomatous neoplasm of spermatic cord: Secondary | ICD-10-CM | POA: Diagnosis not present

## 2022-03-25 DIAGNOSIS — K4020 Bilateral inguinal hernia, without obstruction or gangrene, not specified as recurrent: Secondary | ICD-10-CM | POA: Diagnosis not present

## 2022-03-25 DIAGNOSIS — K219 Gastro-esophageal reflux disease without esophagitis: Secondary | ICD-10-CM | POA: Insufficient documentation

## 2022-03-25 DIAGNOSIS — Z01818 Encounter for other preprocedural examination: Secondary | ICD-10-CM

## 2022-03-25 HISTORY — PX: INGUINAL HERNIA REPAIR: SHX194

## 2022-03-25 SURGERY — REPAIR, HERNIA, INGUINAL, BILATERAL, ADULT
Anesthesia: General | Site: Groin | Laterality: Bilateral

## 2022-03-25 MED ORDER — FENTANYL CITRATE (PF) 100 MCG/2ML IJ SOLN
100.0000 ug | Freq: Once | INTRAMUSCULAR | Status: AC
  Administered 2022-03-25: 50 ug via INTRAVENOUS

## 2022-03-25 MED ORDER — FENTANYL CITRATE (PF) 100 MCG/2ML IJ SOLN
INTRAMUSCULAR | Status: AC
  Filled 2022-03-25: qty 2

## 2022-03-25 MED ORDER — PHENYLEPHRINE 80 MCG/ML (10ML) SYRINGE FOR IV PUSH (FOR BLOOD PRESSURE SUPPORT)
PREFILLED_SYRINGE | INTRAVENOUS | Status: DC | PRN
  Administered 2022-03-25: 160 ug via INTRAVENOUS
  Administered 2022-03-25 (×2): 80 ug via INTRAVENOUS
  Administered 2022-03-25: 160 ug via INTRAVENOUS
  Administered 2022-03-25: 80 ug via INTRAVENOUS
  Administered 2022-03-25: 160 ug via INTRAVENOUS

## 2022-03-25 MED ORDER — BUPIVACAINE HCL (PF) 0.25 % IJ SOLN
INTRAMUSCULAR | Status: AC
  Filled 2022-03-25: qty 30

## 2022-03-25 MED ORDER — CEFAZOLIN SODIUM-DEXTROSE 2-4 GM/100ML-% IV SOLN
INTRAVENOUS | Status: AC
  Filled 2022-03-25: qty 100

## 2022-03-25 MED ORDER — OXYCODONE HCL 5 MG PO TABS: 5.0000 mg | ORAL_TABLET | Freq: Four times a day (QID) | ORAL | 0 refills | Status: AC | PRN

## 2022-03-25 MED ORDER — CHLORHEXIDINE GLUCONATE CLOTH 2 % EX PADS: 6.0000 | MEDICATED_PAD | Freq: Once | CUTANEOUS | Status: DC

## 2022-03-25 MED ORDER — CELECOXIB 200 MG PO CAPS
ORAL_CAPSULE | ORAL | Status: AC
  Filled 2022-03-25: qty 1

## 2022-03-25 MED ORDER — PROMETHAZINE HCL 25 MG/ML IJ SOLN: 6.2500 mg | INTRAMUSCULAR | Status: DC | PRN

## 2022-03-25 MED ORDER — PROPOFOL 500 MG/50ML IV EMUL
INTRAVENOUS | Status: AC
  Filled 2022-03-25: qty 50

## 2022-03-25 MED ORDER — HYDROMORPHONE HCL 1 MG/ML IJ SOLN
INTRAMUSCULAR | Status: AC
  Filled 2022-03-25: qty 0.5

## 2022-03-25 MED ORDER — KETAMINE HCL 100 MG/ML IJ SOLN
INTRAMUSCULAR | Status: AC
  Filled 2022-03-25: qty 1

## 2022-03-25 MED ORDER — CEFAZOLIN SODIUM-DEXTROSE 2-4 GM/100ML-% IV SOLN
2.0000 g | INTRAVENOUS | Status: AC
  Administered 2022-03-25: 2 g via INTRAVENOUS

## 2022-03-25 MED ORDER — EPHEDRINE 5 MG/ML INJ
INTRAVENOUS | Status: AC
  Filled 2022-03-25: qty 5

## 2022-03-25 MED ORDER — DEXAMETHASONE SODIUM PHOSPHATE 4 MG/ML IJ SOLN
INTRAMUSCULAR | Status: DC | PRN
  Administered 2022-03-25 (×2): 2.5 mg

## 2022-03-25 MED ORDER — ONDANSETRON HCL 4 MG/2ML IJ SOLN
INTRAMUSCULAR | Status: AC
  Filled 2022-03-25: qty 2

## 2022-03-25 MED ORDER — AMISULPRIDE (ANTIEMETIC) 5 MG/2ML IV SOLN: 10.0000 mg | Freq: Once | INTRAVENOUS | Status: DC | PRN

## 2022-03-25 MED ORDER — MEPERIDINE HCL 25 MG/ML IJ SOLN: 6.2500 mg | INTRAMUSCULAR | Status: DC | PRN

## 2022-03-25 MED ORDER — ROCURONIUM BROMIDE 10 MG/ML (PF) SYRINGE
PREFILLED_SYRINGE | INTRAVENOUS | Status: DC | PRN
  Administered 2022-03-25: 60 mg via INTRAVENOUS
  Administered 2022-03-25 (×2): 10 mg via INTRAVENOUS
  Administered 2022-03-25: 20 mg via INTRAVENOUS

## 2022-03-25 MED ORDER — GABAPENTIN 300 MG PO CAPS
ORAL_CAPSULE | ORAL | Status: AC
  Filled 2022-03-25: qty 1

## 2022-03-25 MED ORDER — LIDOCAINE 2% (20 MG/ML) 5 ML SYRINGE
INTRAMUSCULAR | Status: AC
  Filled 2022-03-25: qty 5

## 2022-03-25 MED ORDER — PROPOFOL 10 MG/ML IV BOLUS
INTRAVENOUS | Status: DC | PRN
  Administered 2022-03-25: 160 mg via INTRAVENOUS

## 2022-03-25 MED ORDER — MIDAZOLAM HCL 2 MG/2ML IJ SOLN
2.0000 mg | Freq: Once | INTRAMUSCULAR | Status: AC
  Administered 2022-03-25: 2 mg via INTRAVENOUS

## 2022-03-25 MED ORDER — PHENYLEPHRINE 80 MCG/ML (10ML) SYRINGE FOR IV PUSH (FOR BLOOD PRESSURE SUPPORT)
PREFILLED_SYRINGE | INTRAVENOUS | Status: AC
  Filled 2022-03-25: qty 10

## 2022-03-25 MED ORDER — GABAPENTIN 300 MG PO CAPS: 300.0000 mg | ORAL_CAPSULE | Freq: Once | ORAL | Status: DC

## 2022-03-25 MED ORDER — LACTATED RINGERS IV SOLN: INTRAVENOUS | Status: DC

## 2022-03-25 MED ORDER — ACETAMINOPHEN 500 MG PO TABS
1000.0000 mg | ORAL_TABLET | ORAL | Status: AC
  Administered 2022-03-25: 1000 mg via ORAL

## 2022-03-25 MED ORDER — DEXAMETHASONE SODIUM PHOSPHATE 10 MG/ML IJ SOLN
INTRAMUSCULAR | Status: DC | PRN
  Administered 2022-03-25: 10 mg via INTRAVENOUS

## 2022-03-25 MED ORDER — ROCURONIUM BROMIDE 10 MG/ML (PF) SYRINGE
PREFILLED_SYRINGE | INTRAVENOUS | Status: AC
  Filled 2022-03-25: qty 10

## 2022-03-25 MED ORDER — OXYCODONE HCL 5 MG/5ML PO SOLN: 5.0000 mg | Freq: Once | ORAL | Status: AC | PRN

## 2022-03-25 MED ORDER — MIDAZOLAM HCL 2 MG/2ML IJ SOLN
INTRAMUSCULAR | Status: AC
  Filled 2022-03-25: qty 2

## 2022-03-25 MED ORDER — KETAMINE HCL 100 MG/ML IJ SOLN
INTRAMUSCULAR | Status: DC | PRN
  Administered 2022-03-25: 40 mg via INTRAMUSCULAR

## 2022-03-25 MED ORDER — EPINEPHRINE PF 1 MG/ML IJ SOLN
INTRAMUSCULAR | Status: AC
  Filled 2022-03-25: qty 1

## 2022-03-25 MED ORDER — ACETAMINOPHEN 500 MG PO TABS
ORAL_TABLET | ORAL | Status: AC
  Filled 2022-03-25: qty 2

## 2022-03-25 MED ORDER — SUGAMMADEX SODIUM 200 MG/2ML IV SOLN
INTRAVENOUS | Status: DC | PRN
  Administered 2022-03-25: 200 mg via INTRAVENOUS

## 2022-03-25 MED ORDER — ACETAMINOPHEN 500 MG PO TABS: 1000.0000 mg | ORAL_TABLET | Freq: Once | ORAL | Status: DC

## 2022-03-25 MED ORDER — OXYCODONE HCL 5 MG PO TABS
5.0000 mg | ORAL_TABLET | Freq: Once | ORAL | Status: AC | PRN
  Administered 2022-03-25: 5 mg via ORAL

## 2022-03-25 MED ORDER — ONDANSETRON HCL 4 MG/2ML IJ SOLN
INTRAMUSCULAR | Status: DC | PRN
  Administered 2022-03-25: 4 mg via INTRAVENOUS

## 2022-03-25 MED ORDER — CELECOXIB 200 MG PO CAPS
200.0000 mg | ORAL_CAPSULE | ORAL | Status: AC
  Administered 2022-03-25: 200 mg via ORAL

## 2022-03-25 MED ORDER — CLONIDINE HCL (ANALGESIA) 100 MCG/ML EP SOLN
EPIDURAL | Status: DC | PRN
  Administered 2022-03-25 (×2): 50 ug

## 2022-03-25 MED ORDER — DEXAMETHASONE SODIUM PHOSPHATE 10 MG/ML IJ SOLN
INTRAMUSCULAR | Status: AC
  Filled 2022-03-25: qty 1

## 2022-03-25 MED ORDER — EPHEDRINE SULFATE-NACL 50-0.9 MG/10ML-% IV SOSY
PREFILLED_SYRINGE | INTRAVENOUS | Status: DC | PRN
  Administered 2022-03-25: 10 mg via INTRAVENOUS
  Administered 2022-03-25: 5 mg via INTRAVENOUS
  Administered 2022-03-25 (×2): 10 mg via INTRAVENOUS
  Administered 2022-03-25: 5 mg via INTRAVENOUS

## 2022-03-25 MED ORDER — OXYCODONE HCL 5 MG PO TABS
ORAL_TABLET | ORAL | Status: AC
  Filled 2022-03-25: qty 1

## 2022-03-25 MED ORDER — HYDROMORPHONE HCL 1 MG/ML IJ SOLN
0.2500 mg | INTRAMUSCULAR | Status: DC | PRN
  Administered 2022-03-25 (×4): 0.5 mg via INTRAVENOUS

## 2022-03-25 MED ORDER — ROPIVACAINE HCL 5 MG/ML IJ SOLN
INTRAMUSCULAR | Status: DC | PRN
  Administered 2022-03-25 (×2): 25 mL

## 2022-03-25 MED ORDER — 0.9 % SODIUM CHLORIDE (POUR BTL) OPTIME
TOPICAL | Status: DC | PRN
  Administered 2022-03-25: 400 mL

## 2022-03-25 MED ORDER — CELECOXIB 200 MG PO CAPS: 200.0000 mg | ORAL_CAPSULE | Freq: Once | ORAL | Status: DC

## 2022-03-25 MED ORDER — BUPIVACAINE-EPINEPHRINE (PF) 0.5% -1:200000 IJ SOLN
INTRAMUSCULAR | Status: DC | PRN
  Administered 2022-03-25: 10 mL via PERINEURAL

## 2022-03-25 MED ORDER — FENTANYL CITRATE (PF) 100 MCG/2ML IJ SOLN
INTRAMUSCULAR | Status: DC | PRN
  Administered 2022-03-25 (×4): 50 ug via INTRAVENOUS

## 2022-03-25 MED ORDER — LIDOCAINE 2% (20 MG/ML) 5 ML SYRINGE
INTRAMUSCULAR | Status: DC | PRN
  Administered 2022-03-25: 60 mg via INTRAVENOUS

## 2022-03-25 MED ORDER — GABAPENTIN 300 MG PO CAPS
300.0000 mg | ORAL_CAPSULE | ORAL | Status: AC
  Administered 2022-03-25: 300 mg via ORAL

## 2022-03-25 MED ORDER — BUPIVACAINE-EPINEPHRINE (PF) 0.5% -1:200000 IJ SOLN
INTRAMUSCULAR | Status: AC
  Filled 2022-03-25: qty 30

## 2022-03-25 SURGICAL SUPPLY — 52 items
ADH SKN CLS APL DERMABOND .7 (GAUZE/BANDAGES/DRESSINGS) ×1
APL PRP STRL LF DISP 70% ISPRP (MISCELLANEOUS) ×1
APL SKNCLS STERI-STRIP NONHPOA (GAUZE/BANDAGES/DRESSINGS)
BENZOIN TINCTURE PRP APPL 2/3 (GAUZE/BANDAGES/DRESSINGS) IMPLANT
BLADE CLIPPER SURG (BLADE) IMPLANT
BLADE HEX COATED 2.75 (ELECTRODE) ×1 IMPLANT
BLADE SURG 15 STRL LF DISP TIS (BLADE) ×1 IMPLANT
BLADE SURG 15 STRL SS (BLADE) ×1
CANISTER SUCT 1200ML W/VALVE (MISCELLANEOUS) IMPLANT
CHLORAPREP W/TINT 26 (MISCELLANEOUS) ×1 IMPLANT
COVER BACK TABLE 60X90IN (DRAPES) ×1 IMPLANT
COVER MAYO STAND STRL (DRAPES) ×1 IMPLANT
DERMABOND ADVANCED .7 DNX12 (GAUZE/BANDAGES/DRESSINGS) ×1 IMPLANT
DRAIN PENROSE .5X12 LATEX STL (DRAIN) ×1 IMPLANT
DRAPE LAPAROTOMY TRNSV 102X78 (DRAPES) ×1 IMPLANT
DRAPE UTILITY XL STRL (DRAPES) ×1 IMPLANT
ELECT REM PT RETURN 9FT ADLT (ELECTROSURGICAL) ×1
ELECTRODE REM PT RTRN 9FT ADLT (ELECTROSURGICAL) ×1 IMPLANT
GLOVE BIO SURGEON STRL SZ7.5 (GLOVE) ×1 IMPLANT
GLOVE BIO SURGEON STRL SZ8 (GLOVE) IMPLANT
GLOVE BIOGEL PI IND STRL 8 (GLOVE) IMPLANT
GOWN STRL REUS W/ TWL LRG LVL3 (GOWN DISPOSABLE) ×2 IMPLANT
GOWN STRL REUS W/ TWL XL LVL3 (GOWN DISPOSABLE) IMPLANT
GOWN STRL REUS W/TWL LRG LVL3 (GOWN DISPOSABLE) ×1
GOWN STRL REUS W/TWL XL LVL3 (GOWN DISPOSABLE) ×1
MESH ULTRAPRO 6X6 15CM15CM (Mesh General) IMPLANT
MESH VENTRALEX ST 2.5 CRC MED (Mesh General) IMPLANT
NDL HYPO 25X1 1.5 SAFETY (NEEDLE) ×1 IMPLANT
NEEDLE HYPO 22GX1.5 SAFETY (NEEDLE) IMPLANT
NEEDLE HYPO 25X1 1.5 SAFETY (NEEDLE) IMPLANT
NS IRRIG 1000ML POUR BTL (IV SOLUTION) ×1 IMPLANT
PACK BASIN DAY SURGERY FS (CUSTOM PROCEDURE TRAY) ×1 IMPLANT
PENCIL SMOKE EVACUATOR (MISCELLANEOUS) ×1 IMPLANT
SLEEVE SCD COMPRESS KNEE MED (STOCKING) ×1 IMPLANT
SPIKE FLUID TRANSFER (MISCELLANEOUS) IMPLANT
SPONGE T-LAP 18X18 ~~LOC~~+RFID (SPONGE) ×1 IMPLANT
STRIP CLOSURE SKIN 1/2X4 (GAUZE/BANDAGES/DRESSINGS) IMPLANT
SUT MON AB 4-0 PC3 18 (SUTURE) ×1 IMPLANT
SUT PROLENE 2 0 SH DA (SUTURE) ×4 IMPLANT
SUT SILK 2 0 SH (SUTURE) ×1 IMPLANT
SUT SILK 3 0 SH 30 (SUTURE) IMPLANT
SUT SILK 3 0 TIES 17X18 (SUTURE) ×1
SUT SILK 3-0 18XBRD TIE BLK (SUTURE) ×1 IMPLANT
SUT VIC AB 2-0 SH 27 (SUTURE) ×2
SUT VIC AB 2-0 SH 27XBRD (SUTURE) ×1 IMPLANT
SUT VIC AB 3-0 SH 27 (SUTURE) ×3
SUT VIC AB 3-0 SH 27X BRD (SUTURE) ×1 IMPLANT
SUT VICRYL 0 SH 27 (SUTURE) IMPLANT
SYR CONTROL 10ML LL (SYRINGE) ×1 IMPLANT
TOWEL GREEN STERILE FF (TOWEL DISPOSABLE) ×1 IMPLANT
TUBE CONNECTING 20X1/4 (TUBING) IMPLANT
YANKAUER SUCT BULB TIP NO VENT (SUCTIONS) IMPLANT

## 2022-03-25 NOTE — Anesthesia Procedure Notes (Signed)
Procedure Name: Intubation Date/Time: 03/25/2022 8:31 AM  Performed by: Genelle Bal, CRNAPre-anesthesia Checklist: Patient identified, Emergency Drugs available, Suction available and Patient being monitored Patient Re-evaluated:Patient Re-evaluated prior to induction Oxygen Delivery Method: Circle system utilized Preoxygenation: Pre-oxygenation with 100% oxygen Induction Type: IV induction Ventilation: Mask ventilation without difficulty Laryngoscope Size: Miller and 2 Grade View: Grade I Tube type: Oral Tube size: 7.5 mm Number of attempts: 1 Airway Equipment and Method: Stylet Placement Confirmation: ETT inserted through vocal cords under direct vision, positive ETCO2 and breath sounds checked- equal and bilateral Secured at: 23 cm Tube secured with: Tape Dental Injury: Teeth and Oropharynx as per pre-operative assessment

## 2022-03-25 NOTE — Progress Notes (Signed)
Assisted Dr. Lissa Hoard with left and right, transabdominal plane, ultrasound guided blocks. Side rails up, monitors on throughout procedure. See vital signs in flow sheet. Tolerated Procedure well.

## 2022-03-25 NOTE — H&P (Signed)
REFERRING PHYSICIAN: Willy Eddy, MD  PROVIDER: Landry Corporal, MD  MRN: [id number] DOB: 1960/06/09 Subjective   Chief Complaint: New Consultation and Inguinal Hernia (Right)   History of Present Illness: Robert Deleon is a 62 y.o. male who is seen today as an office consultation for evaluation of New Consultation and Inguinal Hernia (Right) .   We are asked to see the patient in consultation by Dr. Dwyane Deleon to evaluate him for a right inguinal hernia. The patient is a 62 year old white male who has been noticing a bulge in the right groin for the last month or 2. He does not remember a particular event that started it. He does state that he had a right inguinal hernia repair with mesh about 20 years ago or more. He denies any nausea or vomiting. He denies any fevers or chills. He has had some discomfort in the right groin.  Review of Systems: A complete review of systems was obtained from the patient. I have reviewed this information and discussed as appropriate with the patient. See HPI as well for other ROS.  Review of Systems  Constitutional: Negative.  HENT: Negative.  Eyes: Negative.  Respiratory: Negative.  Cardiovascular: Negative.  Gastrointestinal: Negative.  Genitourinary: Negative.  Musculoskeletal: Negative.  Skin: Negative.  Neurological: Negative.  Endo/Heme/Allergies: Negative.  Psychiatric/Behavioral: Negative.    Medical History: Past Medical History:  Diagnosis Date  Arthritis   Patient Active Problem List  Diagnosis  Bilateral recurrent inguinal hernia without obstruction or gangrene   Past Surgical History:  Procedure Laterality Date  HERNIA REPAIR  SPINE SURGERY    No Known Allergies  Current Outpatient Medications on File Prior to Visit  Medication Sig Dispense Refill  celecoxib (CELEBREX) 200 MG capsule TAKE ONE CAPSULE BY MOUTH DAILY WITH FOOD  folic acid (FOLVITE) 1 MG tablet  methocarbamoL (ROBAXIN) 750 MG tablet Take by  mouth  methotrexate (RHEUMATREX) 2.5 MG tablet  morphine (MS CONTIN) 30 MG ER tablet  oxyCODONE-acetaminophen (PERCOCET) 10-325 mg tablet Take 1 tablet by mouth every 4 (four) hours as needed  venlafaxine (EFFEXOR) 37.5 MG tablet   No current facility-administered medications on file prior to visit.   Family History  Problem Relation Age of Onset  Breast cancer Mother  Skin cancer Father    Social History   Tobacco Use  Smoking Status Never  Smokeless Tobacco Never    Social History   Socioeconomic History  Marital status: Married  Tobacco Use  Smoking status: Never  Smokeless tobacco: Never  Vaping Use  Vaping Use: Never used  Substance and Sexual Activity  Alcohol use: Yes  Drug use: Never   Objective:   Vitals:  BP: 134/82  Pulse: 100  Temp: 36.7 C (98.1 F)  SpO2: 93%  Weight: 82.6 kg (182 lb)  Height: 177.8 cm ('5\' 10"'$ )   Body mass index is 26.11 kg/m.  Physical Exam Constitutional:  General: He is not in acute distress. Appearance: Normal appearance.  HENT:  Head: Normocephalic and atraumatic.  Right Ear: External ear normal.  Left Ear: External ear normal.  Nose: Nose normal.  Mouth/Throat:  Mouth: Mucous membranes are moist.  Pharynx: Oropharynx is clear.  Eyes:  General: No scleral icterus. Extraocular Movements: Extraocular movements intact.  Conjunctiva/sclera: Conjunctivae normal.  Pupils: Pupils are equal, round, and reactive to light.  Cardiovascular:  Rate and Rhythm: Normal rate and regular rhythm.  Pulses: Normal pulses.  Heart sounds: Normal heart sounds.  Pulmonary:  Effort: Pulmonary effort is normal. No respiratory distress.  Breath sounds: Normal breath sounds.  Abdominal:  General: Abdomen is flat. Bowel sounds are normal. There is no distension.  Palpations: Abdomen is soft.  Tenderness: There is no abdominal tenderness.  Genitourinary: Comments: There is a palpable bulge in the right groin that reduces with  palpation. There is also a similar palpable bulge in the left groin that also reduces with palpation. Musculoskeletal:  General: No swelling or deformity. Normal range of motion.  Cervical back: Normal range of motion and neck supple. No tenderness.  Skin: General: Skin is warm and dry.  Coloration: Skin is not jaundiced.  Neurological:  General: No focal deficit present.  Mental Status: He is alert and oriented to person, place, and time.  Psychiatric:  Mood and Affect: Mood normal.  Behavior: Behavior normal.     Labs, Imaging and Diagnostic Testing:  Assessment and Plan:   Diagnoses and all orders for this visit:  Bilateral recurrent inguinal hernia without obstruction or gangrene    The patient appears to have bilateral inguinal hernias of which the right side may be recurrent. Because of the risk of incarceration and strangulation I feel he would benefit from having this fixed. He would also like to have this done. I have discussed with him in detail the risks and benefits of the operation as well as some of the technical aspects including the use of mesh and the risk of chronic pain and he understands and wishes to proceed. We will contact his rheumatologist to see if he needs to be off the methotrexate prior to surgery. He is followed by Dr. Amil Deleon

## 2022-03-25 NOTE — Interval H&P Note (Signed)
History and Physical Interval Note:  03/25/2022 7:57 AM  Robert Deleon  has presented today for surgery, with the diagnosis of BILATERAL INGUINAL HERNIA.  The various methods of treatment have been discussed with the patient and family. After consideration of risks, benefits and other options for treatment, the patient has consented to  Procedure(s): OPEN HERNIA REPAIR INGUINAL ADULT BILATERAL (Bilateral) as a surgical intervention.  The patient's history has been reviewed, patient examined, no change in status, stable for surgery.  I have reviewed the patient's chart and labs.  Questions were answered to the patient's satisfaction.     [name]

## 2022-03-25 NOTE — Op Note (Signed)
03/25/2022  11:05 AM  PATIENT:  Robert Deleon  62 y.o. male  PRE-OPERATIVE DIAGNOSIS:  BILATERAL INGUINAL HERNIA  POST-OPERATIVE DIAGNOSIS:  BILATERAL INGUINAL HERNIA  PROCEDURE:  Procedure(s): OPEN HERNIA REPAIR INGUINAL ADULT BILATERAL (Bilateral)  SURGEON:  Surgeon(s) and Role:    * Jovita Kussmaul, MD - Primary  PHYSICIAN ASSISTANT:   ASSISTANTS: none   ANESTHESIA:   local and general  EBL:  10 mL   BLOOD ADMINISTERED:none  DRAINS: none   LOCAL MEDICATIONS USED:  MARCAINE     SPECIMEN:  No Specimen  DISPOSITION OF SPECIMEN:  N/A  COUNTS:  YES  TOURNIQUET:  * No tourniquets in log *  DICTATION: .Dragon Dictation  After informed consent was obtained the patient was brought to the operating room and placed in the supine position on the operating table.  After adequate induction of general anesthesia the patient's abdomen and bilateral groin areas were prepped with ChloraPrep, allowed to dry, and draped in usual sterile manner.  An appropriate timeout was performed.  Both groin areas were then infiltrated with quarter percent Marcaine.  Attention was first turned to the left groin.  This was actually the side of the previous hernia repair.  A small incision was made from the edge of the pubic tubercle on the left towards the anterior superior iliac spine.  This incision was carried through the skin and subcutaneous tissue sharply with the electrocautery until the fascia of the external bleak was encountered.  A small bridging vein was clamped with hemostats, divided, and ligated with 3-0 silk ties.  The external oblique fascia was opened along its fibers towards the apex of the external ring with a 15 blade knife and Metzenbaum scissors.  A [place] retractor was deployed.  Blunt dissection was carried out of the cord structures until they could be surrounded between 2 fingers.  1/2 inch Penrose drain was placed around the cord structures for retraction purposes.  There  was some scar tissue in this space but I did not identify anything that looked like mesh.  There was a small circular defect in the floor of the canal medially right next to the pubic tubercle where the hernia was protruding through.  The hernia was able to be reduced back through the hernia defect without difficulty.  I elected to patch the hernia defect with a medium size piece of umbilical mesh.  This mesh was placed through the fascial defect and then the edges of the mesh were splayed out the defect was then closed incorporating the anchors of the mesh with interrupted 2-0 Prolene stitches.  Next a 6 x 6 piece of ultra Pro mesh was cut in half.  1 piece of 3 x 6 mesh was cut to the appropriate size.  The mesh was sewed inferiorly to the shelving edge of the inguinal ligament with a running 2-0 Prolene stitch.  Tails were cut in the mesh laterally and the tails were wrapped around the cord structures.  Medially and superiorly the mesh was sewed to the muscular aponeurotic strength layer of the transversalis with interrupted 2-0 Prolene vertical mattress stitches.  Lateral to the cord the tails of the mesh were anchored to the shelving edge of the inguinal ligament with an interrupted 2-0 Prolene stitch.  Once this was accomplished the mesh appeared to be in good position and the hernia seem well repaired.  The wound was irrigated with copious amounts of saline.  The ilioinguinal nerve was looked for but never seen.  The external oblique fascia was then reapproximated with a running 2-0 Vicryl stitch.  The subcutaneous fascia was closed with a running 3-0 Vicryl stitch.  The skin was closed with a running 4-0 Monocryl subcuticular stitch.  Attention was then turned to the right groin.  A similar incision was made from the edge of the pubic tubercle towards the anterior superior iliac spine with a 15 blade knife.  The incision was carried through the skin and subcutaneous tissue sharply with the electrocautery  until the fascia of the external oblique was encountered.  A small bridging vein was clamped with hemostats, divided, and ligated with 3-0 silk ties.  The external oblique fascia was opened along its fibers towards the apex of the external ring with a 15 blade knife and Metzenbaum scissors.  A [place] retractor was deployed.  Blunt dissection was carried out of the cord structures until they could be surrounded between 2 fingers.  Half inch Penrose drain was placed around the cord structures for retraction purposes.  There was a moderate-sized lipoma of the cord that was actually identified on both sides and was excised sharply with the electrocautery and the base was clamped with a hemostat, divided, and ligated with 3-0 silk ties.  The cord structures were then bluntly skeletonized until I was able to identify a hernia sac.  The sac was broad-based so it was reduced back beneath the transversalis layer and then the floor of the canal was repaired with interrupted 0 Vicryl stitches.  A 3 x 6 piece of ultra Pro mesh was chosen and cut to the appropriate size.  The mesh was sewed inferiorly to the shelving edge of the inguinal ligament with a running 2-0 Prolene stitch.  Tails were cut in the mesh laterally and the tails were wrapped around the cord structures.  Medially and superiorly the mesh was sewed to the muscular aponeurotic strength layer of the transversalis with interrupted 2-0 Prolene vertical mattress stitches.  Lateral to the cord the tails of the mesh were anchored to the shelving edge of the inguinal ligament with interrupted 2-0 Prolene stitch.  Once this was accomplished the mesh appeared to be in good position and the hernia seem well repaired.  The wound was irrigated with copious amounts of saline.  The ilioinguinal nerve was looked for but never seen.  The external oblique fascia was then closed with a running 2-0 Vicryl stitch.  The subcutaneous fascia was closed with a running 3-0 Vicryl  stitch.  The skin was then closed with a running 4-0 Monocryl subcuticular stitch.  Dermabond dressings were applied.  The patient tolerated the procedure well.  At the end of the case all needle sponge and instrument counts were correct.  The patient was then awakened and taken to recovery in stable condition.  The patient's testicles were in the scrotum at the end of the case.  PLAN OF CARE: Discharge to home after PACU  PATIENT DISPOSITION:  PACU - hemodynamically stable.   Delay start of Pharmacological VTE agent (>24hrs) due to surgical blood loss or risk of bleeding: not applicable

## 2022-03-25 NOTE — Anesthesia Procedure Notes (Signed)
Anesthesia Regional Block: TAP block   Pre-Anesthetic Checklist: , timeout performed,  Correct Patient, Correct Site, Correct Laterality,  Correct Procedure, Correct Position, site marked,  Risks and benefits discussed,  Surgical consent,  Pre-op evaluation,  At surgeon's request and post-op pain management  Laterality: Left and Right  Prep: chloraprep       Needles:  Injection technique: Single-shot  Needle Type: Echogenic Stimulator Needle      Needle Gauge: 21     Additional Needles:   Procedures:,,,, ultrasound used (permanent image in chart),,    Narrative:  Start time: 03/25/2022 7:47 AM End time: 03/25/2022 8:12 AM Injection made incrementally with aspirations every 5 mL.  Performed by: Personally  Anesthesiologist: Nolon Nations, MD  Additional Notes: BP cuff, SpO2 and EKG monitors applied. Sedation begun.  Anesthetic injected incrementally, slowly, and after neg aspirations under direct ultrasound guidance. Good fascial spread noted. Patient tolerated well.

## 2022-03-25 NOTE — Anesthesia Postprocedure Evaluation (Signed)
Anesthesia Post Note  Patient: Robert Deleon  Procedure(s) Performed: OPEN HERNIA REPAIR INGUINAL ADULT BILATERAL (Bilateral: Groin)     Patient location during evaluation: PACU Anesthesia Type: General Level of consciousness: sedated and patient cooperative Pain management: pain level controlled Vital Signs Assessment: post-procedure vital signs reviewed and stable Respiratory status: spontaneous breathing Cardiovascular status: stable Anesthetic complications: no   No notable events documented.  Last Vitals:  Vitals:   03/25/22 1245 03/25/22 1257  BP:  (!) 113/98  Pulse: 99 99  Resp: 12 16  Temp:  36.6 C  SpO2: 95% 95%    Last Pain:  Vitals:   03/25/22 1257  TempSrc:   PainSc: [place]

## 2022-03-25 NOTE — Discharge Instructions (Signed)
May have tylenol again after 1130, and ibuprofen after 1pm   Post Anesthesia Home Care Instructions  Activity: Get plenty of rest for the remainder of the day. A responsible individual must stay with you for 24 hours following the procedure.  For the next 24 hours, DO NOT: -Drive a car -Paediatric nurse -Drink alcoholic beverages -Take any medication unless instructed by your physician -Make any legal decisions or sign important papers.  Meals: Start with liquid foods such as gelatin or soup. Progress to regular foods as tolerated. Avoid greasy, spicy, heavy foods. If nausea and/or vomiting occur, drink only clear liquids until the nausea and/or vomiting subsides. Call your physician if vomiting continues.  Special Instructions/Symptoms: Your throat may feel dry or sore from the anesthesia or the breathing tube placed in your throat during surgery. If this causes discomfort, gargle with warm salt water. The discomfort should disappear within 24 hours.  If you had a scopolamine patch placed behind your ear for the management of post- operative nausea and/or vomiting:  1. The medication in the patch is effective for 72 hours, after which it should be removed.  Wrap patch in a tissue and discard in the trash. Wash hands thoroughly with soap and water. 2. You may remove the patch earlier than 72 hours if you experience unpleasant side effects which may include dry mouth, dizziness or visual disturbances. 3. Avoid touching the patch. Wash your hands with soap and water after contact with the patch.    Post Anesthesia Home Care Instructions  Activity: Get plenty of rest for the remainder of the day. A responsible individual must stay with you for 24 hours following the procedure.  For the next 24 hours, DO NOT: -Drive a car -Paediatric nurse -Drink alcoholic beverages -Take any medication unless instructed by your physician -Make any legal decisions or sign important  papers.  Meals: Start with liquid foods such as gelatin or soup. Progress to regular foods as tolerated. Avoid greasy, spicy, heavy foods. If nausea and/or vomiting occur, drink only clear liquids until the nausea and/or vomiting subsides. Call your physician if vomiting continues.  Special Instructions/Symptoms: Your throat may feel dry or sore from the anesthesia or the breathing tube placed in your throat during surgery. If this causes discomfort, gargle with warm salt water. The discomfort should disappear within 24 hours.

## 2022-03-25 NOTE — Transfer of Care (Signed)
Immediate Anesthesia Transfer of Care Note  Patient: Robert Deleon  Procedure(s) Performed: OPEN HERNIA REPAIR INGUINAL ADULT BILATERAL (Bilateral: Groin)  Patient Location: PACU  Anesthesia Type:GA combined with regional for post-op pain  Level of Consciousness: awake, alert , and oriented  Airway & Oxygen Therapy: Patient Spontanous Breathing and Patient connected to face mask oxygen  Post-op Assessment: Report given to RN and Post -op Vital signs reviewed and stable  Post vital signs: Reviewed and stable  Last Vitals:  Vitals Value Taken Time  BP 154/79 03/25/22 1115  Temp    Pulse 97 03/25/22 1118  Resp 12 03/25/22 1118  SpO2 99 % 03/25/22 1118  Vitals shown include unvalidated device data.  Last Pain:  Vitals:   03/25/22 0731  TempSrc: Oral  PainSc: 0-No pain      Patients Stated Pain Goal: 8 (48/59/27 [id number])  Complications: No notable events documented.

## 2022-03-28 ENCOUNTER — Encounter (HOSPITAL_BASED_OUTPATIENT_CLINIC_OR_DEPARTMENT_OTHER): Payer: Self-pay | Admitting: General Surgery

## 2022-03-28 NOTE — Progress Notes (Signed)
Left message stating courtesy call and if any questions or concerns please call the doctors office.  

## 2022-05-16 DIAGNOSIS — G4733 Obstructive sleep apnea (adult) (pediatric): Secondary | ICD-10-CM | POA: Diagnosis not present

## 2022-05-16 DIAGNOSIS — G894 Chronic pain syndrome: Secondary | ICD-10-CM | POA: Diagnosis not present

## 2022-05-16 DIAGNOSIS — M62830 Muscle spasm of back: Secondary | ICD-10-CM | POA: Diagnosis not present

## 2022-05-16 DIAGNOSIS — Z79891 Long term (current) use of opiate analgesic: Secondary | ICD-10-CM | POA: Diagnosis not present

## 2022-05-16 DIAGNOSIS — M5416 Radiculopathy, lumbar region: Secondary | ICD-10-CM | POA: Diagnosis not present

## 2022-06-08 DIAGNOSIS — M5136 Other intervertebral disc degeneration, lumbar region: Secondary | ICD-10-CM | POA: Diagnosis not present

## 2022-06-08 DIAGNOSIS — M545 Low back pain, unspecified: Secondary | ICD-10-CM | POA: Diagnosis not present

## 2022-06-08 DIAGNOSIS — M0579 Rheumatoid arthritis with rheumatoid factor of multiple sites without organ or systems involvement: Secondary | ICD-10-CM | POA: Diagnosis not present

## 2022-06-08 DIAGNOSIS — M25552 Pain in left hip: Secondary | ICD-10-CM | POA: Diagnosis not present

## 2022-06-08 DIAGNOSIS — M1991 Primary osteoarthritis, unspecified site: Secondary | ICD-10-CM | POA: Diagnosis not present

## 2022-06-08 DIAGNOSIS — Z111 Encounter for screening for respiratory tuberculosis: Secondary | ICD-10-CM | POA: Diagnosis not present

## 2022-07-11 DIAGNOSIS — G4733 Obstructive sleep apnea (adult) (pediatric): Secondary | ICD-10-CM | POA: Diagnosis not present

## 2022-07-11 DIAGNOSIS — M5416 Radiculopathy, lumbar region: Secondary | ICD-10-CM | POA: Diagnosis not present

## 2022-07-11 DIAGNOSIS — M62830 Muscle spasm of back: Secondary | ICD-10-CM | POA: Diagnosis not present

## 2022-07-11 DIAGNOSIS — G894 Chronic pain syndrome: Secondary | ICD-10-CM | POA: Diagnosis not present

## 2022-07-13 DIAGNOSIS — M0579 Rheumatoid arthritis with rheumatoid factor of multiple sites without organ or systems involvement: Secondary | ICD-10-CM | POA: Diagnosis not present

## 2022-07-27 DIAGNOSIS — M0579 Rheumatoid arthritis with rheumatoid factor of multiple sites without organ or systems involvement: Secondary | ICD-10-CM | POA: Diagnosis not present

## 2022-08-10 DIAGNOSIS — M0579 Rheumatoid arthritis with rheumatoid factor of multiple sites without organ or systems involvement: Secondary | ICD-10-CM | POA: Diagnosis not present

## 2022-09-07 DIAGNOSIS — G894 Chronic pain syndrome: Secondary | ICD-10-CM | POA: Diagnosis not present

## 2022-09-07 DIAGNOSIS — R5383 Other fatigue: Secondary | ICD-10-CM | POA: Diagnosis not present

## 2022-09-07 DIAGNOSIS — Z79899 Other long term (current) drug therapy: Secondary | ICD-10-CM | POA: Diagnosis not present

## 2022-09-07 DIAGNOSIS — M5416 Radiculopathy, lumbar region: Secondary | ICD-10-CM | POA: Diagnosis not present

## 2022-09-07 DIAGNOSIS — M0579 Rheumatoid arthritis with rheumatoid factor of multiple sites without organ or systems involvement: Secondary | ICD-10-CM | POA: Diagnosis not present

## 2022-09-07 DIAGNOSIS — M62830 Muscle spasm of back: Secondary | ICD-10-CM | POA: Diagnosis not present

## 2022-09-07 DIAGNOSIS — G4733 Obstructive sleep apnea (adult) (pediatric): Secondary | ICD-10-CM | POA: Diagnosis not present

## 2022-09-28 DIAGNOSIS — M5136 Other intervertebral disc degeneration, lumbar region: Secondary | ICD-10-CM | POA: Diagnosis not present

## 2022-09-28 DIAGNOSIS — M1991 Primary osteoarthritis, unspecified site: Secondary | ICD-10-CM | POA: Diagnosis not present

## 2022-09-28 DIAGNOSIS — M0579 Rheumatoid arthritis with rheumatoid factor of multiple sites without organ or systems involvement: Secondary | ICD-10-CM | POA: Diagnosis not present

## 2022-10-05 DIAGNOSIS — M0579 Rheumatoid arthritis with rheumatoid factor of multiple sites without organ or systems involvement: Secondary | ICD-10-CM | POA: Diagnosis not present

## 2022-10-31 DIAGNOSIS — M62830 Muscle spasm of back: Secondary | ICD-10-CM | POA: Diagnosis not present

## 2022-10-31 DIAGNOSIS — G894 Chronic pain syndrome: Secondary | ICD-10-CM | POA: Diagnosis not present

## 2022-10-31 DIAGNOSIS — G4733 Obstructive sleep apnea (adult) (pediatric): Secondary | ICD-10-CM | POA: Diagnosis not present

## 2022-10-31 DIAGNOSIS — Z79891 Long term (current) use of opiate analgesic: Secondary | ICD-10-CM | POA: Diagnosis not present

## 2022-10-31 DIAGNOSIS — M5416 Radiculopathy, lumbar region: Secondary | ICD-10-CM | POA: Diagnosis not present

## 2022-11-02 DIAGNOSIS — M0579 Rheumatoid arthritis with rheumatoid factor of multiple sites without organ or systems involvement: Secondary | ICD-10-CM | POA: Diagnosis not present

## 2022-12-05 DIAGNOSIS — M0579 Rheumatoid arthritis with rheumatoid factor of multiple sites without organ or systems involvement: Secondary | ICD-10-CM | POA: Diagnosis not present

## 2022-12-26 DIAGNOSIS — M5416 Radiculopathy, lumbar region: Secondary | ICD-10-CM | POA: Diagnosis not present

## 2022-12-26 DIAGNOSIS — M62830 Muscle spasm of back: Secondary | ICD-10-CM | POA: Diagnosis not present

## 2022-12-26 DIAGNOSIS — G4733 Obstructive sleep apnea (adult) (pediatric): Secondary | ICD-10-CM | POA: Diagnosis not present

## 2022-12-26 DIAGNOSIS — G894 Chronic pain syndrome: Secondary | ICD-10-CM | POA: Diagnosis not present

## 2023-01-02 DIAGNOSIS — M0579 Rheumatoid arthritis with rheumatoid factor of multiple sites without organ or systems involvement: Secondary | ICD-10-CM | POA: Diagnosis not present

## 2023-01-09 DIAGNOSIS — M1991 Primary osteoarthritis, unspecified site: Secondary | ICD-10-CM | POA: Diagnosis not present

## 2023-01-09 DIAGNOSIS — M0579 Rheumatoid arthritis with rheumatoid factor of multiple sites without organ or systems involvement: Secondary | ICD-10-CM | POA: Diagnosis not present

## 2023-01-09 DIAGNOSIS — M5136 Other intervertebral disc degeneration, lumbar region with discogenic back pain only: Secondary | ICD-10-CM | POA: Diagnosis not present

## 2023-01-09 DIAGNOSIS — M7989 Other specified soft tissue disorders: Secondary | ICD-10-CM | POA: Diagnosis not present

## 2023-01-30 DIAGNOSIS — M0579 Rheumatoid arthritis with rheumatoid factor of multiple sites without organ or systems involvement: Secondary | ICD-10-CM | POA: Diagnosis not present

## 2023-02-20 DIAGNOSIS — G894 Chronic pain syndrome: Secondary | ICD-10-CM | POA: Diagnosis not present

## 2023-02-20 DIAGNOSIS — M5416 Radiculopathy, lumbar region: Secondary | ICD-10-CM | POA: Diagnosis not present

## 2023-02-20 DIAGNOSIS — M62830 Muscle spasm of back: Secondary | ICD-10-CM | POA: Diagnosis not present

## 2023-02-20 DIAGNOSIS — G4733 Obstructive sleep apnea (adult) (pediatric): Secondary | ICD-10-CM | POA: Diagnosis not present

## 2023-02-27 DIAGNOSIS — M0579 Rheumatoid arthritis with rheumatoid factor of multiple sites without organ or systems involvement: Secondary | ICD-10-CM | POA: Diagnosis not present

## 2023-03-30 DIAGNOSIS — M0579 Rheumatoid arthritis with rheumatoid factor of multiple sites without organ or systems involvement: Secondary | ICD-10-CM | POA: Diagnosis not present

## 2023-04-24 DIAGNOSIS — M5416 Radiculopathy, lumbar region: Secondary | ICD-10-CM | POA: Diagnosis not present

## 2023-04-24 DIAGNOSIS — G4733 Obstructive sleep apnea (adult) (pediatric): Secondary | ICD-10-CM | POA: Diagnosis not present

## 2023-04-24 DIAGNOSIS — M62830 Muscle spasm of back: Secondary | ICD-10-CM | POA: Diagnosis not present

## 2023-04-24 DIAGNOSIS — G894 Chronic pain syndrome: Secondary | ICD-10-CM | POA: Diagnosis not present

## 2023-04-27 DIAGNOSIS — M0579 Rheumatoid arthritis with rheumatoid factor of multiple sites without organ or systems involvement: Secondary | ICD-10-CM | POA: Diagnosis not present

## 2023-05-12 DIAGNOSIS — M1991 Primary osteoarthritis, unspecified site: Secondary | ICD-10-CM | POA: Diagnosis not present

## 2023-05-12 DIAGNOSIS — M0579 Rheumatoid arthritis with rheumatoid factor of multiple sites without organ or systems involvement: Secondary | ICD-10-CM | POA: Diagnosis not present

## 2023-05-25 DIAGNOSIS — M0579 Rheumatoid arthritis with rheumatoid factor of multiple sites without organ or systems involvement: Secondary | ICD-10-CM | POA: Diagnosis not present

## 2023-05-25 DIAGNOSIS — Z111 Encounter for screening for respiratory tuberculosis: Secondary | ICD-10-CM | POA: Diagnosis not present

## 2023-05-25 DIAGNOSIS — Z79899 Other long term (current) drug therapy: Secondary | ICD-10-CM | POA: Diagnosis not present

## 2023-06-19 DIAGNOSIS — G4733 Obstructive sleep apnea (adult) (pediatric): Secondary | ICD-10-CM | POA: Diagnosis not present

## 2023-06-19 DIAGNOSIS — G894 Chronic pain syndrome: Secondary | ICD-10-CM | POA: Diagnosis not present

## 2023-06-19 DIAGNOSIS — M5416 Radiculopathy, lumbar region: Secondary | ICD-10-CM | POA: Diagnosis not present

## 2023-06-19 DIAGNOSIS — M62830 Muscle spasm of back: Secondary | ICD-10-CM | POA: Diagnosis not present

## 2023-06-22 DIAGNOSIS — M0579 Rheumatoid arthritis with rheumatoid factor of multiple sites without organ or systems involvement: Secondary | ICD-10-CM | POA: Diagnosis not present

## 2023-07-20 DIAGNOSIS — M0579 Rheumatoid arthritis with rheumatoid factor of multiple sites without organ or systems involvement: Secondary | ICD-10-CM | POA: Diagnosis not present

## 2023-08-15 DIAGNOSIS — G894 Chronic pain syndrome: Secondary | ICD-10-CM | POA: Diagnosis not present

## 2023-08-15 DIAGNOSIS — M5416 Radiculopathy, lumbar region: Secondary | ICD-10-CM | POA: Diagnosis not present

## 2023-08-15 DIAGNOSIS — G4733 Obstructive sleep apnea (adult) (pediatric): Secondary | ICD-10-CM | POA: Diagnosis not present

## 2023-08-15 DIAGNOSIS — M62830 Muscle spasm of back: Secondary | ICD-10-CM | POA: Diagnosis not present

## 2023-08-17 DIAGNOSIS — R5383 Other fatigue: Secondary | ICD-10-CM | POA: Diagnosis not present

## 2023-08-17 DIAGNOSIS — Z79899 Other long term (current) drug therapy: Secondary | ICD-10-CM | POA: Diagnosis not present

## 2023-08-17 DIAGNOSIS — M0579 Rheumatoid arthritis with rheumatoid factor of multiple sites without organ or systems involvement: Secondary | ICD-10-CM | POA: Diagnosis not present

## 2023-09-14 DIAGNOSIS — M0579 Rheumatoid arthritis with rheumatoid factor of multiple sites without organ or systems involvement: Secondary | ICD-10-CM | POA: Diagnosis not present

## 2023-10-09 DIAGNOSIS — M5416 Radiculopathy, lumbar region: Secondary | ICD-10-CM | POA: Diagnosis not present

## 2023-10-09 DIAGNOSIS — M62830 Muscle spasm of back: Secondary | ICD-10-CM | POA: Diagnosis not present

## 2023-10-09 DIAGNOSIS — G894 Chronic pain syndrome: Secondary | ICD-10-CM | POA: Diagnosis not present

## 2023-10-09 DIAGNOSIS — G4733 Obstructive sleep apnea (adult) (pediatric): Secondary | ICD-10-CM | POA: Diagnosis not present

## 2023-10-12 DIAGNOSIS — M0579 Rheumatoid arthritis with rheumatoid factor of multiple sites without organ or systems involvement: Secondary | ICD-10-CM | POA: Diagnosis not present

## 2023-10-12 DIAGNOSIS — Z79899 Other long term (current) drug therapy: Secondary | ICD-10-CM | POA: Diagnosis not present

## 2023-11-09 DIAGNOSIS — M0579 Rheumatoid arthritis with rheumatoid factor of multiple sites without organ or systems involvement: Secondary | ICD-10-CM | POA: Diagnosis not present

## 2023-11-10 DIAGNOSIS — M1991 Primary osteoarthritis, unspecified site: Secondary | ICD-10-CM | POA: Diagnosis not present

## 2023-11-10 DIAGNOSIS — M0579 Rheumatoid arthritis with rheumatoid factor of multiple sites without organ or systems involvement: Secondary | ICD-10-CM | POA: Diagnosis not present

## 2023-12-06 DIAGNOSIS — G4733 Obstructive sleep apnea (adult) (pediatric): Secondary | ICD-10-CM | POA: Diagnosis not present

## 2023-12-06 DIAGNOSIS — M5416 Radiculopathy, lumbar region: Secondary | ICD-10-CM | POA: Diagnosis not present

## 2023-12-06 DIAGNOSIS — G894 Chronic pain syndrome: Secondary | ICD-10-CM | POA: Diagnosis not present

## 2023-12-06 DIAGNOSIS — M62830 Muscle spasm of back: Secondary | ICD-10-CM | POA: Diagnosis not present

## 2023-12-07 DIAGNOSIS — M0579 Rheumatoid arthritis with rheumatoid factor of multiple sites without organ or systems involvement: Secondary | ICD-10-CM | POA: Diagnosis not present

## 2024-01-04 DIAGNOSIS — M0579 Rheumatoid arthritis with rheumatoid factor of multiple sites without organ or systems involvement: Secondary | ICD-10-CM | POA: Diagnosis not present

## 2024-01-31 DIAGNOSIS — M62830 Muscle spasm of back: Secondary | ICD-10-CM | POA: Diagnosis not present

## 2024-01-31 DIAGNOSIS — G4733 Obstructive sleep apnea (adult) (pediatric): Secondary | ICD-10-CM | POA: Diagnosis not present

## 2024-01-31 DIAGNOSIS — M5416 Radiculopathy, lumbar region: Secondary | ICD-10-CM | POA: Diagnosis not present

## 2024-01-31 DIAGNOSIS — G894 Chronic pain syndrome: Secondary | ICD-10-CM | POA: Diagnosis not present

## 2024-02-01 DIAGNOSIS — M0579 Rheumatoid arthritis with rheumatoid factor of multiple sites without organ or systems involvement: Secondary | ICD-10-CM | POA: Diagnosis not present

## 2024-02-29 DIAGNOSIS — M0579 Rheumatoid arthritis with rheumatoid factor of multiple sites without organ or systems involvement: Secondary | ICD-10-CM | POA: Diagnosis not present
